# Patient Record
Sex: Female | Born: 1985
Health system: Southern US, Community
[De-identification: ages and names within clinical notes are randomized; demographics above are authoritative.]

## PROBLEM LIST (undated history)

## (undated) DIAGNOSIS — B20 Human immunodeficiency virus [HIV] disease: Secondary | ICD-10-CM

## (undated) DIAGNOSIS — D649 Anemia, unspecified: Secondary | ICD-10-CM

## (undated) DIAGNOSIS — Z21 Asymptomatic human immunodeficiency virus [HIV] infection status: Secondary | ICD-10-CM

---

## 2019-12-23 ENCOUNTER — Emergency Department
Admission: EM | Admit: 2019-12-23 | Discharge: 2019-12-23 | Disposition: A | Discharge status: Home / Self Care | Payer: Medicaid Other | Attending: Emergency Medicine | Admitting: Emergency Medicine

## 2019-12-23 ENCOUNTER — Emergency Department: Payer: Medicaid Other

## 2019-12-23 ENCOUNTER — Encounter: Payer: Self-pay | Admitting: Emergency Medicine

## 2019-12-23 ENCOUNTER — Other Ambulatory Visit: Payer: Self-pay

## 2019-12-23 DIAGNOSIS — Y990 Civilian activity done for income or pay: Secondary | ICD-10-CM | POA: Diagnosis not present

## 2019-12-23 DIAGNOSIS — S161XXA Strain of muscle, fascia and tendon at neck level, initial encounter: Secondary | ICD-10-CM | POA: Insufficient documentation

## 2019-12-23 DIAGNOSIS — S199XXA Unspecified injury of neck, initial encounter: Secondary | ICD-10-CM | POA: Diagnosis present

## 2019-12-23 DIAGNOSIS — Y93I9 Activity, other involving external motion: Secondary | ICD-10-CM | POA: Insufficient documentation

## 2019-12-23 DIAGNOSIS — X501XXA Overexertion from prolonged static or awkward postures, initial encounter: Secondary | ICD-10-CM | POA: Diagnosis not present

## 2019-12-23 DIAGNOSIS — Y9269 Other specified industrial and construction area as the place of occurrence of the external cause: Secondary | ICD-10-CM | POA: Diagnosis not present

## 2019-12-23 MED ORDER — DIAZEPAM 2 MG PO TABS
2.0000 mg | ORAL_TABLET | Freq: Three times a day (TID) | ORAL | 0 refills | Status: DC | PRN
Start: 2019-12-23 — End: 2020-11-27

## 2019-12-23 NOTE — ED Triage Notes (Signed)
Pt reports was [place] the [place] this [place] and thinks she strained her neck. Pt reports painful to look left/right or up and down

## 2019-12-23 NOTE — ED Provider Notes (Signed)
[facility] Emergency Department Provider Note   ____________________________________________   First MD Initiated Contact with Patient 12/23/19 1638     (approximate)  I have reviewed the triage vital signs and the nursing notes.   HISTORY  Chief Complaint Neck Pain   HPI Gabrielle Lambert is a 34 y.o. female who was wrestling with her children couple days ago.  She had some posterior neck pain and it hurts to look down or to either side.  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  She moved the first 3 without incident but the fourth 1 was difficult to handle and she pulled her neck muscles and now her neck is hurting worse.  She is tried Tylenol and Motrin and Aleve at home and none of it works very well.  Heating pad works very well though.  While the heating pad is hot she has pain relief but as soon as it cools off the pain comes back.  The pain is in the middle of the back around C7 and runs up both sides of the neck posteriorly.  It is moderately severe.  Tight and achy.         History reviewed. No pertinent past medical history.  There are no problems to display for this patient.   History reviewed. No pertinent surgical history.  Prior to Admission medications   Medication Sig Start Date End Date Taking? Authorizing Provider  diazepam (VALIUM) 2 MG tablet Take 1 tablet (2 mg total) by mouth every 8 (eight) hours as needed for anxiety or muscle spasms. 12/23/19 12/22/20  Arnaldo Natal, MD    Allergies Patient has no allergy information on record.  No family history on file.  Social History Social History   Tobacco Use  . Smoking status: Not on file  Substance Use Topics  . Alcohol use: Not on file  . Drug use: Not on file    Review of Systems  Constitutional: No fever/chills Eyes: No visual changes. ENT: No sore throat. Cardiovascular: Denies chest pain. Respiratory:  Denies shortness of breath. Gastrointestinal: No abdominal pain.  No nausea, no vomiting.  No diarrhea.  No constipation. Genitourinary: Negative for dysuria. Musculoskeletal: Negative for back pain. Skin: Negative for rash. Neurological: Negative for headaches, focal weakness   ____________________________________________   PHYSICAL EXAM:  VITAL SIGNS: ED Triage Vitals  Enc Vitals Group     BP 12/23/19 1506 112/72     Pulse Rate 12/23/19 1506 64     Resp 12/23/19 1506 20     Temp 12/23/19 1506 98.9 F (37.2 C)     Temp Source 12/23/19 1506 Oral     SpO2 12/23/19 1506 100 %     Weight 12/23/19 1504 161 lb (73 kg)     Height 12/23/19 1504 5\' 6"  (1.676 m)     Head Circumference --      Peak Flow --      Pain Score 12/23/19 1504 8     Pain Loc --      Pain Edu? --      Excl. in GC? --     Constitutional: Alert and oriented. Well appearing and in no acute distress.  She is sitting very stiffly though. Eyes: Conjunctivae are normal.  Head: Atraumatic. Nose: No congestion/rhinnorhea. Mouth/Throat: Mucous membranes are moist.  Oropharynx non-erythematous. Neck: No stridor.  Patient is tender to palpation midline at about C  C6-7.  Paraspinous muscles are also tender.  Cardiovascular: Normal rate, regular rhythm. Peri Jefferson peripheral circulation. Respiratory: Normal respiratory effort.  No retractions.  Musculoskeletal: See above neck. Neurologic:  Normal speech and language. No gross focal neurologic deficits are appreciated.  Skin:  Skin is warm, dry and intact. No rash noted. Psychiatric: Mood and affect are normal. Speech and behavior are normal.  ____________________________________________   LABS (all labs ordered are listed, but only abnormal results are displayed)  Labs Reviewed - No data to display ____________________________________________  EKG   ____________________________________________  RADIOLOGY  ED MD interpretation: X-rays read by radiology reviewed  by me are negative  Official radiology report(s): DG Cervical Spine Complete  Result Date: 12/23/2019 CLINICAL DATA:  34 year old female with neck pain. EXAM: CERVICAL SPINE - COMPLETE 4+ VIEW COMPARISON:  None. FINDINGS: There is no evidence of cervical spine fracture or prevertebral soft tissue swelling. Alignment is normal. No other significant bone abnormalities are identified. IMPRESSION: Negative cervical spine radiographs. Electronically Signed   By: Elgie Collard M.D.   On: 12/23/2019 17:29    ____________________________________________   PROCEDURES  Procedure(s) performed (including Critical Care):  Procedures   ____________________________________________   INITIAL IMPRESSION / ASSESSMENT AND PLAN / ED COURSE  Patient with neck pain after wrestling with her children lifting heavy boxes.  There is no evidence of any fracture or swelling on the x-rays no palpable defects or lumps.  Likely she has neck strain.  We will treat her with Motrin heat and some Valium for the muscle spasm that she is feeling.  We will give her a work note and she can follow-up with her regular physician for physical therapy.  Of course she can return if she is worse.             ____________________________________________   FINAL CLINICAL IMPRESSION(S) / ED DIAGNOSES  Final diagnoses:  Acute strain of neck muscle, initial encounter     ED Discharge Orders         Ordered    diazepam (VALIUM) 2 MG tablet  Every 8 hours PRN     Discontinue  Reprint     12/23/19 1737           Note:  This document was prepared using Dragon voice recognition software and may include unintentional dictation errors.    Arnaldo Natal, MD 12/23/19 1740

## 2019-12-23 NOTE — Discharge Instructions (Addendum)
Continue to use your heating pad and Tylenol or Motrin for pain.  Be careful not to fall asleep on a heating pad as you can get burns.  Use the Valium 2 mg up to 3 times a day to help with muscle spasms.  Be careful can make you woozy.  Do not drive on it.  Return for worse pain numbness or weakness or any other complaints.  Follow-up with your regular doctor in about a week.  It may be that some physical therapy will help once the acute pain is better.  Your doctor can arrange this for you.

## 2019-12-23 NOTE — ED Notes (Signed)
See triage note  Presents with pain to back of her neck  States she was wrestling with her children this weekend  developed pain  States pain eased off but she pulled some crutches while at work and felt increased pain

## 2019-12-28 ENCOUNTER — Other Ambulatory Visit: Payer: Self-pay

## 2019-12-28 ENCOUNTER — Encounter: Payer: Self-pay | Admitting: Emergency Medicine

## 2019-12-28 DIAGNOSIS — R6883 Chills (without fever): Secondary | ICD-10-CM | POA: Insufficient documentation

## 2019-12-28 DIAGNOSIS — J029 Acute pharyngitis, unspecified: Secondary | ICD-10-CM | POA: Insufficient documentation

## 2019-12-28 DIAGNOSIS — Z5321 Procedure and treatment not carried out due to patient leaving prior to being seen by health care provider: Secondary | ICD-10-CM | POA: Insufficient documentation

## 2019-12-28 DIAGNOSIS — R519 Headache, unspecified: Secondary | ICD-10-CM | POA: Insufficient documentation

## 2019-12-28 LAB — GROUP A STREP BY PCR: Group A Strep by PCR: NOT DETECTED

## 2019-12-28 MED ORDER — IBUPROFEN 400 MG PO TABS
400.0000 mg | ORAL_TABLET | Freq: Once | ORAL | Status: AC | PRN
  Administered 2019-12-28: 400 mg via ORAL
  Filled 2019-12-28: qty 1

## 2019-12-28 NOTE — ED Triage Notes (Signed)
Patient with complaint of chills, sore throat and headache that started yesterday.

## 2019-12-29 ENCOUNTER — Emergency Department
Admission: EM | Admit: 2019-12-29 | Discharge: 2019-12-29 | Disposition: A | Discharge status: Home / Self Care | Payer: Medicaid Other | Attending: Emergency Medicine | Admitting: Emergency Medicine

## 2019-12-29 HISTORY — DX: Anemia, unspecified: D64.9

## 2019-12-29 HISTORY — DX: Asymptomatic human immunodeficiency virus (hiv) infection status: Z21

## 2019-12-29 HISTORY — DX: Human immunodeficiency virus (HIV) disease: B20

## 2019-12-29 NOTE — ED Notes (Signed)
No answer when called to room patient.  Could not be found outside, bathroom, or in lobby

## 2020-04-09 ENCOUNTER — Emergency Department: Payer: Medicaid Other

## 2020-04-09 ENCOUNTER — Emergency Department
Admission: EM | Admit: 2020-04-09 | Discharge: 2020-04-09 | Disposition: A | Discharge status: Home / Self Care | Payer: Medicaid Other | Attending: Emergency Medicine | Admitting: Emergency Medicine

## 2020-04-09 ENCOUNTER — Other Ambulatory Visit: Payer: Self-pay

## 2020-04-09 ENCOUNTER — Encounter: Payer: Self-pay | Admitting: Emergency Medicine

## 2020-04-09 DIAGNOSIS — M546 Pain in thoracic spine: Secondary | ICD-10-CM | POA: Diagnosis present

## 2020-04-09 LAB — COMPREHENSIVE METABOLIC PANEL
ALT: 13 U/L (ref 0–44)
AST: 19 U/L (ref 15–41)
Albumin: 3.7 g/dL (ref 3.5–5.0)
Alkaline Phosphatase: 63 U/L (ref 38–126)
Anion gap: 11 (ref 5–15)
BUN: 9 mg/dL (ref 6–20)
CO2: 22 mmol/L (ref 22–32)
Calcium: 8.8 mg/dL — ABNORMAL LOW (ref 8.9–10.3)
Chloride: 105 mmol/L (ref 98–111)
Creatinine, Ser: 0.91 mg/dL (ref 0.44–1.00)
GFR, Estimated: >60 mL/min (ref >60)
Glucose, Bld: 125 mg/dL — ABNORMAL HIGH (ref 70–99)
Potassium: 4 mmol/L (ref 3.5–5.1)
Sodium: 138 mmol/L (ref 135–145)
Total Bilirubin: 0.4 mg/dL (ref 0.3–1.2)
Total Protein: 7.5 g/dL (ref 6.5–8.1)

## 2020-04-09 LAB — CBC WITH DIFFERENTIAL/PLATELET
Abs Immature Granulocytes: 0.02 10*3/uL (ref 0.00–0.07)
Basophils Absolute: 0 10*3/uL (ref 0.0–0.1)
Basophils Relative: 1 %
Eosinophils Absolute: 0.1 10*3/uL (ref 0.0–0.5)
Eosinophils Relative: 1 %
HCT: 38.8 % (ref 36.0–46.0)
Hemoglobin: 12.5 g/dL (ref 12.0–15.0)
Immature Granulocytes: 0 %
Lymphocytes Relative: 42 %
Lymphs Abs: 2.8 10*3/uL (ref 0.7–4.0)
MCH: 23 pg — ABNORMAL LOW (ref 26.0–34.0)
MCHC: 32.2 g/dL (ref 30.0–36.0)
MCV: 71.5 fL — ABNORMAL LOW (ref 80.0–100.0)
Monocytes Absolute: 0.6 10*3/uL (ref 0.1–1.0)
Monocytes Relative: 8 %
Neutro Abs: 3.2 10*3/uL (ref 1.7–7.7)
Neutrophils Relative %: 48 %
Platelets: 320 10*3/uL (ref 150–400)
RBC: 5.43 MIL/uL — ABNORMAL HIGH (ref 3.87–5.11)
RDW: 14.9 % (ref 11.5–15.5)
WBC: 6.7 10*3/uL (ref 4.0–10.5)
nRBC: 0 % (ref 0.0–0.2)

## 2020-04-09 LAB — TROPONIN I (HIGH SENSITIVITY): Troponin I (High Sensitivity): <2 ng/L (ref <18)

## 2020-04-09 LAB — FIBRIN DERIVATIVES D-DIMER (ARMC ONLY): Fibrin derivatives D-dimer (ARMC): 537.53 ng/mL (FEU) — ABNORMAL HIGH (ref 0.00–499.00)

## 2020-04-09 MED ORDER — CYCLOBENZAPRINE HCL 10 MG PO TABS: 10.0000 mg | ORAL_TABLET | Freq: Three times a day (TID) | ORAL | 0 refills | Status: AC | PRN

## 2020-04-09 MED ORDER — IOHEXOL 350 MG/ML SOLN
75.0000 mL | Freq: Once | INTRAVENOUS | Status: AC | PRN
  Administered 2020-04-09: 75 mL via INTRAVENOUS

## 2020-04-09 NOTE — ED Triage Notes (Signed)
Pt presents to ED via POV with c/o R upper back pain under her ribs, pt states pain started Monday, states pain increases with deep breaths, pt states feels SOB due to being unable to take a deep breath. Pt states initially thought pain was due to gas and has tried gas relief measures without relief. Pt A&O x4, ambulatory without difficulty, able to speak in full sentences with mild difficulty, pt states due to pain from taking a deep breath with speaking.

## 2020-04-09 NOTE — ED Notes (Addendum)
Pt reports 4-5 days ago she started with back pain and into "rib" on right side - Pt reports pain with deep inspiration and SHOB with exertion - Pt reports pain is worse with palpation  Pt reports increase in passing gas with some relief felt from passing gas yesterday

## 2020-04-09 NOTE — ED Notes (Signed)
First Nurse Note: Pt ambulatory into ED c/o right sided back pain. Pt states that it feels like the pain is under her ribs. Pt is in NAD.

## 2020-04-09 NOTE — ED Provider Notes (Signed)
[facility] Emergency Department Provider Note   ____________________________________________   First MD Initiated Contact with Patient 04/09/20 [phone]     (approximate)  I have reviewed the triage vital signs and the nursing notes.   HISTORY  Chief Complaint Back Pain and Shortness of Breath    HPI Gabrielle Lambert is a 34 y.o. female with a stated past medical history of anemia and HIV who presents for right lower thoracic and right lower lateral rib cage pain that began approximately 48 hours prior to presentation in the emergency department.  Patient describes 8/10, sharp, nonradiating back pain that is worse with deep inspiration and has no specific relieving factors.         Past Medical History:  Diagnosis Date  . Anemia   . HIV (human immunodeficiency virus infection) (HCC)     There are no problems to display for this patient.   Past Surgical History:  Procedure Laterality Date  . CESAREAN SECTION    . CESAREAN SECTION      Prior to Admission medications   Medication Sig Start Date End Date Taking? Authorizing Provider  cyclobenzaprine (FLEXERIL) 10 MG tablet Take 1 tablet (10 mg total) by mouth 3 (three) times daily as needed for up to 4 days for muscle spasms (left chest pain). 04/09/20 04/13/20  Merwyn Katos, MD  diazepam (VALIUM) 2 MG tablet Take 1 tablet (2 mg total) by mouth every 8 (eight) hours as needed for anxiety or muscle spasms. 12/23/19 12/22/20  Arnaldo Natal, MD    Allergies Patient has no known allergies.  History reviewed. No pertinent family history.  Social History Social History   Tobacco Use  . Smoking status: Never Smoker  . Smokeless tobacco: Never Used  Vaping Use  . Vaping Use: Every day  Substance Use Topics  . Alcohol use: Yes    Comment: occ  . Drug use: Never    Review of Systems Constitutional: No fever/chills Eyes: No visual changes. ENT: No sore throat. Cardiovascular: Denies chest  pain. Respiratory: Denies shortness of breath. Gastrointestinal: No abdominal pain.  No nausea, no vomiting.  No diarrhea. Genitourinary: Negative for dysuria. Musculoskeletal: Positive for acute thoracic back pain Skin: Negative for rash. Neurological: Negative for headaches, weakness/numbness/paresthesias in any extremity Psychiatric: Negative for suicidal ideation/homicidal ideation   ____________________________________________   PHYSICAL EXAM:  VITAL SIGNS: ED Triage Vitals  Enc Vitals Group     BP 04/09/20 0909 124/73     Pulse Rate 04/09/20 0909 70     Resp 04/09/20 0909 (!) 21     Temp 04/09/20 0909 99.3 F (37.4 C)     Temp Source 04/09/20 0909 Oral     SpO2 04/09/20 0909 99 %     Weight 04/09/20 0906 167 lb (75.8 kg)     Height 04/09/20 0906 5\' 6"  (1.676 m)     Head Circumference --      Peak Flow --      Pain Score 04/09/20 0906 10     Pain Loc --      Pain Edu? --      Excl. in GC? --    Constitutional: Alert and oriented. Well appearing and in no acute distress. Eyes: Conjunctivae are normal. PERRL. Head: Atraumatic. Nose: No congestion/rhinnorhea. Mouth/Throat: Mucous membranes are moist. Neck: No stridor Cardiovascular: Grossly normal heart sounds.  Good peripheral circulation. Respiratory: Normal respiratory effort.  No retractions. Gastrointestinal: Soft and nontender. No distention. Musculoskeletal: No obvious deformities.  Tenderness palpation over right lateral rib cage Neurologic:  Normal speech and language. No gross focal neurologic deficits are appreciated. Skin:  Skin is warm and dry. No rash noted. Psychiatric: Mood and affect are normal. Speech and behavior are normal.  ____________________________________________   LABS (all labs ordered are listed, but only abnormal results are displayed)  Labs Reviewed  CBC WITH DIFFERENTIAL/PLATELET - Abnormal; Notable for the following components:      Result Value   RBC 5.43 (*)    MCV 71.5 (*)     MCH 23.0 (*)    All other components within normal limits  COMPREHENSIVE METABOLIC PANEL - Abnormal; Notable for the following components:   Glucose, Bld 125 (*)    Calcium 8.8 (*)    All other components within normal limits  FIBRIN DERIVATIVES D-DIMER ([facility] ONLY) - Abnormal; Notable for the following components:   Fibrin derivatives D-dimer ([facility]) 537.53 (*)    All other components within normal limits  TROPONIN I (HIGH SENSITIVITY)  TROPONIN I (HIGH SENSITIVITY)   ____________________________________________  EKG  ED ECG REPORT I, Merwyn Katos, the attending physician, personally viewed and interpreted this ECG.  Date: 04/09/2020 EKG Time: 0906 Rate: 78 Rhythm: normal sinus rhythm QRS Axis: normal Intervals: normal ST/T Wave abnormalities: normal Narrative Interpretation: no evidence of acute ischemia  ____________________________________________  RADIOLOGY  ED MD interpretation: 2 view x-ray of the chest as well as CT angiography shows no evidence of acute abnormalities including no pulmonary emboli, pneumonia, pneumothorax, or widened mediastinum  Official radiology report(s): DG Chest 2 View  Result Date: 04/09/2020 CLINICAL DATA:  Cough. Thoracic region back pain, worse on the right. EXAM: CHEST - 2 VIEW COMPARISON:  None. FINDINGS: Heart size is normal. Mediastinal shadows are normal. The lungs are clear. No infiltrate, collapse or effusion. Question 3 mm granuloma left upper lobe, not likely significant. No bone abnormality. IMPRESSION: No active disease. Question 3 mm granuloma left upper lobe, not likely significant. Electronically Signed   By: Paulina Fusi M.D.   On: 04/09/2020 09:42   CT Angio Chest PE W/Cm &/Or Wo Cm  Result Date: 04/09/2020 CLINICAL DATA:  Suspected pulmonary embolism. Shortness of breath. Right-sided chest pain. EXAM: CT ANGIOGRAPHY CHEST WITH CONTRAST TECHNIQUE: Multidetector CT imaging of the chest was performed using the standard  protocol during bolus administration of intravenous contrast. Multiplanar CT image reconstructions and MIPs were obtained to evaluate the vascular anatomy. CONTRAST:  73mL OMNIPAQUE IOHEXOL 350 MG/ML SOLN COMPARISON:  Chest radiography same day FINDINGS: Cardiovascular: Pulmonary arterial opacification is good. There are no pulmonary emboli. Heart size is normal. No aortic pathology seen. Mediastinum/Nodes: No mass or adenopathy. Lungs/Pleura: Normal Upper Abdomen: Normal Musculoskeletal: Normal Review of the MIP images confirms the above findings. IMPRESSION: Normal examination. No pulmonary emboli or other acute chest pathology. Electronically Signed   By: Paulina Fusi M.D.   On: 04/09/2020 11:06    ____________________________________________   PROCEDURES  Procedure(s) performed (including Critical Care):  Procedures   ____________________________________________   INITIAL IMPRESSION / ASSESSMENT AND PLAN / ED COURSE  As part of my medical decision making, I reviewed the following data within the electronic MEDICAL RECORD NUMBER Nursing notes reviewed and incorporated, Labs reviewed, EKG interpreted, Old chart reviewed, Radiograph reviewed and Notes from prior ED visits reviewed and incorporated        Patient presents for low back pain. Given History and Exam the patient appears to be at low risk for Spinal Cord Compression Syndrome, Vertebral Malignancy/Mets,  acute Spinal Fracture, Vertebral Osteomyelitis, Epidural Abscess, Infected or Obstructing Kidney Stone.  Their presentation appears most likely to be secondary to non-emergent musculoskeletal etiology vs non-emergent disc herniation.  ED Workup: Defer imaging and labwork for outpatient follow up at this time.  Disposition: Discharge. Strict return precautions discussed with patient with full understanding. Advised patient to follow up promptly with primary care provider       ____________________________________________   FINAL CLINICAL IMPRESSION(S) / ED DIAGNOSES  Final diagnoses:  Acute right-sided thoracic back pain     ED Discharge Orders         Ordered    cyclobenzaprine (FLEXERIL) 10 MG tablet  3 times daily PRN        04/09/20 1205           Note:  This document was prepared using Dragon voice recognition software and may include unintentional dictation errors.   Merwyn Katos, MD 04/09/20 [phone]

## 2020-07-08 ENCOUNTER — Other Ambulatory Visit: Payer: Self-pay

## 2020-07-08 ENCOUNTER — Emergency Department
Admission: EM | Admit: 2020-07-08 | Discharge: 2020-07-08 | Disposition: A | Discharge status: Home / Self Care | Payer: Medicaid Other | Attending: Emergency Medicine | Admitting: Emergency Medicine

## 2020-07-08 DIAGNOSIS — Z21 Asymptomatic human immunodeficiency virus [HIV] infection status: Secondary | ICD-10-CM | POA: Diagnosis not present

## 2020-07-08 DIAGNOSIS — K0889 Other specified disorders of teeth and supporting structures: Secondary | ICD-10-CM | POA: Insufficient documentation

## 2020-07-08 MED ORDER — OXYCODONE-ACETAMINOPHEN 7.5-325 MG PO TABS: 1.0000 | ORAL_TABLET | ORAL | 0 refills | Status: DC | PRN

## 2020-07-08 MED ORDER — OXYCODONE-ACETAMINOPHEN 5-325 MG PO TABS
1.0000 | ORAL_TABLET | Freq: Once | ORAL | Status: AC
  Administered 2020-07-08: 1 via ORAL
  Filled 2020-07-08: qty 1

## 2020-07-08 MED ORDER — LIDOCAINE VISCOUS HCL 2 % MT SOLN
5.0000 mL | Freq: Four times a day (QID) | OROMUCOSAL | 0 refills | Status: DC | PRN
Start: 2020-07-08 — End: 2020-08-09

## 2020-07-08 MED ORDER — AMOXICILLIN 500 MG PO CAPS
500.0000 mg | ORAL_CAPSULE | Freq: Three times a day (TID) | ORAL | 0 refills | Status: DC
Start: 2020-07-08 — End: 2020-08-09

## 2020-07-08 MED ORDER — IBUPROFEN 600 MG PO TABS
600.0000 mg | ORAL_TABLET | Freq: Once | ORAL | Status: AC
  Administered 2020-07-08: 600 mg via ORAL
  Filled 2020-07-08: qty 1

## 2020-07-08 MED ORDER — IBUPROFEN 600 MG PO TABS: 600.0000 mg | ORAL_TABLET | Freq: Three times a day (TID) | ORAL | 0 refills | Status: DC | PRN

## 2020-07-08 MED ORDER — LIDOCAINE VISCOUS HCL 2 % MT SOLN
15.0000 mL | Freq: Once | OROMUCOSAL | Status: AC
  Administered 2020-07-08: 15 mL via OROMUCOSAL
  Filled 2020-07-08: qty 15

## 2020-07-08 NOTE — ED Triage Notes (Signed)
Pt c/o left upper tooth pain since Tuesday night

## 2020-07-08 NOTE — ED Provider Notes (Signed)
[facility] Emergency Department Provider Note   ____________________________________________   Event Date/Time   First MD Initiated Contact with Patient 07/08/20 0815     (approximate)  I have reviewed the triage vital signs and the nursing notes.   HISTORY  Chief Complaint Dental Pain    HPI Gabrielle Lambert is a 35 y.o. female patient presents for increased dental pain over the past 10 days.  Patient has history of impacted wisdom teeth.  Patient states the left upper molar has now developed a cavity increase in her pain.  Patient the pain is a 10/10.  Patient describes the pain as "achy".  No relief with over-the-counter Tylenol.         Past Medical History:  Diagnosis Date  . Anemia   . HIV (human immunodeficiency virus infection) (HCC)     There are no problems to display for this patient.   Past Surgical History:  Procedure Laterality Date  . CESAREAN SECTION    . CESAREAN SECTION      Prior to Admission medications   Medication Sig Start Date End Date Taking? Authorizing Provider  amoxicillin (AMOXIL) 500 MG capsule Take 1 capsule (500 mg total) by mouth 3 (three) times daily. 07/08/20  Yes Joni Reining, PA-C  ibuprofen (ADVIL) 600 MG tablet Take 1 tablet (600 mg total) by mouth every 8 (eight) hours as needed. 07/08/20  Yes Joni Reining, PA-C  lidocaine (XYLOCAINE) 2 % solution Use as directed 5 mLs in the mouth or throat every 6 (six) hours as needed for mouth pain. For oral swish. 07/08/20  Yes Joni Reining, PA-C  oxyCODONE-acetaminophen (PERCOCET) 7.5-325 MG tablet Take 1 tablet by mouth every 4 (four) hours as needed for severe pain. 07/08/20  Yes Joni Reining, PA-C  diazepam (VALIUM) 2 MG tablet Take 1 tablet (2 mg total) by mouth every 8 (eight) hours as needed for anxiety or muscle spasms. 12/23/19 12/22/20  Arnaldo Natal, MD    Allergies Patient has no known allergies.  No family history on file.  Social  History Social History   Tobacco Use  . Smoking status: Never Smoker  . Smokeless tobacco: Never Used  Vaping Use  . Vaping Use: Every day  Substance Use Topics  . Alcohol use: Yes    Comment: occ  . Drug use: Never    Review of Systems Constitutional: No fever/chills Eyes: No visual changes. ENT: No sore throat.  Dental pain Cardiovascular: Denies chest pain. Respiratory: Denies shortness of breath. Gastrointestinal: No abdominal pain.  No nausea, no vomiting.  No diarrhea.  No constipation. Genitourinary: Negative for dysuria. Musculoskeletal: Negative for back pain. Skin: Negative for rash. Neurological: Negative for headaches, focal weakness or numbness. Hematological/Lymphatic:  Anemia Allergic/Immunilogical: HIV  ____________________________________________   PHYSICAL EXAM:  VITAL SIGNS: ED Triage Vitals  Enc Vitals Group     BP 07/08/20 0756 125/89     Pulse Rate 07/08/20 0755 87     Resp 07/08/20 0755 18     Temp 07/08/20 0755 98.3 F (36.8 C)     Temp Source 07/08/20 0755 Oral     SpO2 07/08/20 0755 98 %     Weight 07/08/20 0755 170 lb (77.1 kg)     Height 07/08/20 0755 5\' 6"  (1.676 m)     Head Circumference --      Peak Flow --      Pain Score 07/08/20 0755 10     Pain Loc --  Pain Edu? --      Excl. in GC? --    Constitutional: Alert and oriented. Well appearing and in no acute distress. Eyes: Conjunctivae are normal. PERRL. EOMI. Head: Atraumatic. Nose: No congestion/rhinnorhea. Mouth/Throat: Mucous membranes are moist.  Oropharynx non-erythematous.  Gingiva edema and caries at tooth #1and 16. Neck: No stridor. Hematological/Lymphatic/Immunilogical: No cervical lymphadenopathy. Cardiovascular: Normal rate, regular rhythm. Grossly normal heart sounds.  Good peripheral circulation. Respiratory: Normal respiratory effort.  No retractions. Lungs CTAB. Neurologic:  Normal speech and language. No gross focal neurologic deficits are appreciated. No  gait instability. Skin:  Skin is warm, dry and intact. No rash noted. Psychiatric: Mood and affect are normal. Speech and behavior are normal.  ____________________________________________   LABS (all labs ordered are listed, but only abnormal results are displayed)  Labs Reviewed - No data to display ____________________________________________  EKG   ____________________________________________  RADIOLOGY I, Joni Reining, personally viewed and evaluated these images (plain radiographs) as part of my medical decision making, as well as reviewing the written report by the radiologist.  ED MD interpretation:    Official radiology report(s): No results found.  ____________________________________________   PROCEDURES  Procedure(s) performed (including Critical Care):  Procedures   ____________________________________________   INITIAL IMPRESSION / ASSESSMENT AND PLAN / ED COURSE  As part of my medical decision making, I reviewed the following data within the electronic MEDICAL RECORD NUMBER         Patient presents with pain secondary to impacted wisdom tooth with cavity.  Patient given discharge care instruction advised take medication as directed.  Patient given list of dental clinics for follow-up care.      ____________________________________________   FINAL CLINICAL IMPRESSION(S) / ED DIAGNOSES  Final diagnoses:  Pain, dental     ED Discharge Orders         Ordered    amoxicillin (AMOXIL) 500 MG capsule  3 times daily        07/08/20 0832    oxyCODONE-acetaminophen (PERCOCET) 7.5-325 MG tablet  Every 4 hours PRN        07/08/20 0832    ibuprofen (ADVIL) 600 MG tablet  Every 8 hours PRN        07/08/20 0832    lidocaine (XYLOCAINE) 2 % solution  Every 6 hours PRN        07/08/20 0454          *Please note:  Louretta Tantillo was evaluated in Emergency Department on 07/08/2020 for the symptoms described in the history of present illness. She was  evaluated in the context of the global COVID-19 pandemic, which necessitated consideration that the patient might be at risk for infection with the SARS-CoV-2 virus that causes COVID-19. Institutional protocols and algorithms that pertain to the evaluation of patients at risk for COVID-19 are in a state of rapid change based on information released by regulatory bodies including the CDC and federal and state organizations. These policies and algorithms were followed during the patient's care in the ED.  Some ED evaluations and interventions may be delayed as a result of limited staffing during and the pandemic.*   Note:  This document was prepared using Dragon voice recognition software and may include unintentional dictation errors.    Joni Reining, PA-C 07/08/20 0981    Concha Se, MD 07/08/20 [phone]

## 2020-07-08 NOTE — Discharge Instructions (Addendum)
Follow discharge care instruction take medication as directed.  Follow-up from list of dental clinics listed in your discharge care instructions. OPTIONS FOR DENTAL FOLLOW UP CARE  [place] Department of Health and Human Services - Local Safety Net Dental Clinics [email or web address]   [facility] [phone])  Sharl Ma [phone])  [place] [phone])  [facility] [phone])  [facility] [phone]) This clinic caters to the indigent population and is on a lottery system. Location: [employer] [facility], [employer], [address] [place] Clinic Hours: Wednesdays from 6pm - 9pm, patients seen by a lottery system. For dates, call or go to [email or web address] Services: Cleanings, fillings and simple extractions. Payment Options: DENTAL WORK IS FREE OF CHARGE. Bring proof of income or support. Best way to get seen: Arrive at 5:15 pm - this is a lottery, NOT first come/first serve, so arriving earlier will not increase your chances of being seen.     [facility] [phone] Select option 1 for emergencies   Location: [facility], [place], [address] [place] Clinic Hours: No walk-ins accepted - call the day before to schedule an appointment. Check in times are 9:30 am and 1:30 pm. Services: Simple extractions, temporary fillings, pulpectomy/pulp debridement, uncomplicated abscess drainage. Payment Options: PAYMENT IS DUE AT THE TIME OF SERVICE.  Fee is usually $100-200, additional surgical procedures (e.g. abscess drainage) may be extra. Cash, checks, Visa/MasterCard accepted.  Can file Medicaid if patient is covered for dental - patient should call case worker to check. No discount for [facility] patients. Best way to get seen: MUST call the day before  and get onto the schedule. Can usually be seen the next 1-2 days. No walk-ins accepted.     [facility] [phone]   Location: [facility], [address] [place] Clinic Hours: M, W, Th, F 8am or 1:30pm, Tues 9a or 1:30 - first come/first served. Services: Simple extractions, temporary fillings, uncomplicated abscess drainage.  You do not need to be an [facility] resident. Payment Options: PAYMENT IS DUE AT THE TIME OF SERVICE. Dental insurance, otherwise sliding scale - bring proof of income or support. Depending on income and treatment needed, cost is usually $50-200. Best way to get seen: Arrive early as it is first come/first served.     [facility] [phone]   Location: [address] Clinic Hours: Mon-Thu 8a-5p Services: Most basic dental services including extractions and fillings. Payment Options: PAYMENT IS DUE AT THE TIME OF SERVICE. Sliding scale, up to 50% off - bring proof if income or support. Medicaid with dental option accepted. Best way to get seen: Call to schedule an appointment, can usually be seen within 2 weeks OR they will try to see walk-ins - show up at 8a or 2p (you may have to wait).     [facility] [phone] [place] RESIDENTS ONLY   Location: [facility], [address] [place], [place] [address] Clinic Hours: By appointment only. Monday - Thursday 8am-5pm, Friday 8am-12pm Services: Cleanings, fillings, extractions. Payment Options: PAYMENT IS DUE AT THE TIME OF SERVICE. Cash, Visa or MasterCard. Sliding scale - $30 minimum per service. Best way to get seen: Come in to office, complete packet and make an appointment - need proof of income or support monies for each household member and proof of [facility] residence. Usually takes about  a month to get in.     [facility] [phone]   Location: [address] [place] Clinic Hours: Walk-in Urgent Care Dental Services are offered Monday-Friday mornings only. The numbers of emergencies accepted daily is limited to the number of providers available. Maximum 15 - Mondays, Wednesdays & Thursdays Maximum 10 - Tuesdays & Fridays Services: You do not need to be a [facility] resident to be seen for a dental emergency. Emergencies are defined as pain, swelling, abnormal bleeding, or dental trauma. Walkins will receive x-rays if needed. NOTE: Dental cleaning is not an emergency. Payment Options: PAYMENT IS DUE AT THE TIME OF SERVICE. Minimum co-pay is $40.00 for uninsured patients. Minimum co-pay is $3.00 for Medicaid with dental coverage. Dental Insurance is accepted and must be presented at time of visit. Medicare does not cover dental. Forms of payment: Cash, credit card, checks. Best way to get seen: If not previously registered with the clinic, walk-in dental registration begins at 7:15 am and is on a first come/first serve basis. If previously registered with the clinic, call to make an appointment.     [facility] [place] ONLY   Location: [address], [place], [place] Clinic Hours: Mon-Thu 10a-2p Services: Extractions only! Payment Options: FREE (donations accepted) - bring proof of income or support Best way to get seen: Call and schedule an appointment OR come at 8am on the 1st Monday of every month (except for holidays) when it is first come/first served.     [facility] [phone]   Location: [place], [place] Clinic Hours: Friday mornings Services, Payment Options, Best way to get seen: Call for info

## 2020-08-09 ENCOUNTER — Emergency Department: Payer: Medicaid Other

## 2020-08-09 ENCOUNTER — Emergency Department
Admission: EM | Admit: 2020-08-09 | Discharge: 2020-08-09 | Disposition: A | Discharge status: Home / Self Care | Payer: Medicaid Other | Attending: Emergency Medicine | Admitting: Emergency Medicine

## 2020-08-09 ENCOUNTER — Other Ambulatory Visit: Payer: Self-pay

## 2020-08-09 DIAGNOSIS — J209 Acute bronchitis, unspecified: Secondary | ICD-10-CM | POA: Diagnosis not present

## 2020-08-09 DIAGNOSIS — Z20822 Contact with and (suspected) exposure to covid-19: Secondary | ICD-10-CM | POA: Insufficient documentation

## 2020-08-09 DIAGNOSIS — R059 Cough, unspecified: Secondary | ICD-10-CM | POA: Diagnosis present

## 2020-08-09 LAB — RESP PANEL BY RT-PCR (FLU A&B, COVID) ARPGX2
Influenza A by PCR: NEGATIVE
Influenza B by PCR: NEGATIVE
SARS Coronavirus 2 by RT PCR: NEGATIVE

## 2020-08-09 MED ORDER — HYDROCOD POLST-CPM POLST ER 10-8 MG/5ML PO SUER: 5.0000 mL | Freq: Two times a day (BID) | ORAL | 0 refills | Status: DC | PRN

## 2020-08-09 MED ORDER — METHYLPREDNISOLONE 4 MG PO TBPK
ORAL_TABLET | ORAL | 0 refills | Status: DC
Start: 2020-08-09 — End: 2020-11-27

## 2020-08-09 MED ORDER — AZITHROMYCIN 250 MG PO TABS
ORAL_TABLET | ORAL | 0 refills | Status: DC
Start: 2020-08-09 — End: 2020-11-27

## 2020-08-09 NOTE — ED Provider Notes (Signed)
[facility] Emergency Department Provider Note  ____________________________________________   Event Date/Time   First MD Initiated Contact with Patient 08/09/20 1040     (approximate)  I have reviewed the triage vital signs and the nursing notes.   HISTORY  Chief Complaint URI    HPI Gabrielle Lambert is a 35 y.o. female presents to the emergency department with URI symptoms for 5 days.   Is complaining of cough, congestion, fever, chills, chest pain, shortness of breath denies close contact with Covid19+ patient, patient is vaccinated.  Patient states that the cough is productive with mucus.  States her body hurts all over.  States her son had a mild cough last week.  He has gotten better.  Patient has history of HIV   Past Medical History:  Diagnosis Date  . Anemia   . HIV (human immunodeficiency virus infection) (HCC)     There are no problems to display for this patient.   Past Surgical History:  Procedure Laterality Date  . CESAREAN SECTION    . CESAREAN SECTION      Prior to Admission medications   Medication Sig Start Date End Date Taking? Authorizing Provider  azithromycin (ZITHROMAX Z-PAK) 250 MG tablet 2 pills today then 1 pill a day for 4 days 08/09/20  Yes Pritika Alvarez, Roselyn Bering, PA-C  chlorpheniramine-HYDROcodone (TUSSIONEX PENNKINETIC ER) 10-8 MG/5ML SUER Take 5 mLs by mouth every 12 (twelve) hours as needed for cough. 08/09/20  Yes Ouita Nish, Roselyn Bering, PA-C  methylPREDNISolone (MEDROL DOSEPAK) 4 MG TBPK tablet Take 6 pills on day one then decrease by 1 pill each day 08/09/20  Yes Xzavion Doswell, Roselyn Bering, PA-C  diazepam (VALIUM) 2 MG tablet Take 1 tablet (2 mg total) by mouth every 8 (eight) hours as needed for anxiety or muscle spasms. 12/23/19 12/22/20  Arnaldo Natal, MD    Allergies Patient has no known allergies.  No family history on file.  Social History Social History   Tobacco Use  . Smoking status: Never Smoker  . Smokeless tobacco:  Never Used  Vaping Use  . Vaping Use: Every day  Substance Use Topics  . Alcohol use: Yes    Comment: occ  . Drug use: Never    Review of Systems  Constitutional: Positive fever/chills Eyes: No visual changes. ENT: Denies sore throat. Respiratory: Positive cough Cardiovascular: Positive chest pain Gastrointestinal: Denies abdominal pain Genitourinary: Negative for dysuria. Musculoskeletal: Negative for back pain. Skin: Negative for rash. Neurological: Denies neurological changes    ____________________________________________   PHYSICAL EXAM:  VITAL SIGNS: ED Triage Vitals  Enc Vitals Group     BP 08/09/20 1025 109/82     Pulse Rate 08/09/20 1025 (!) 112     Resp 08/09/20 1025 18     Temp 08/09/20 1025 98.7 F (37.1 C)     Temp Source 08/09/20 1025 Oral     SpO2 08/09/20 1025 99 %     Weight 08/09/20 1026 180 lb (81.6 kg)     Height 08/09/20 1026 5\' 6"  (1.676 m)     Head Circumference --      Peak Flow --      Pain Score 08/09/20 1026 10     Pain Loc --      Pain Edu? --      Excl. in GC? --     Constitutional: Alert and oriented. Well appearing and in no acute distress. Eyes: Conjunctivae are normal.  Head: Atraumatic. Nose: No congestion/rhinnorhea. Mouth/Throat: Mucous membranes  are moist.   Neck:  supple no lymphadenopathy noted Cardiovascular: Normal rate, regular rhythm. Heart sounds are normal Respiratory: Normal respiratory effort.  No retractions, lungs CTA GU: deferred Musculoskeletal: FROM all extremities, warm and well perfused Neurologic:  Normal speech and language.  Skin:  Skin is warm, dry and intact. No rash noted. Psychiatric: Mood and affect are normal. Speech and behavior are normal.  ____________________________________________   LABS (all labs ordered are listed, but only abnormal results are displayed)  Labs Reviewed  RESP PANEL BY RT-PCR (FLU A&B, COVID) [id number]    ____________________________________________   ____________________________________________  RADIOLOGY  Chest x-ray  ____________________________________________   PROCEDURES  Procedure(s) performed: No  Procedures    ____________________________________________   INITIAL IMPRESSION / ASSESSMENT AND PLAN / ED COURSE  Pertinent labs & imaging results that were available during my care of the patient were reviewed by me and considered in my medical decision making (see chart for details).   Patient is a HIV-positive 35 year old female who complains of URI symptoms.  Patient appears to be stable  Chest x-ray test for covid/flu   Chest x-ray reviewed by me confirmed by radiology to be normal, Covid/flu test is negative  Did explain the findings to the patient.  She is to follow-up with your regular doctor if any concerns.  Take the antibiotic, steroid, cough medication as prescribed, return emergency department for worsening. OTC measures discussed     Gabrielle Lambert was evaluated in Emergency Department on 08/09/2020 for the symptoms described in the history of present illness. She was evaluated in the context of the global COVID-19 pandemic, which necessitated consideration that the patient might be at risk for infection with the SARS-CoV-2 virus that causes COVID-19. Institutional protocols and algorithms that pertain to the evaluation of patients at risk for COVID-19 are in a state of rapid change based on information released by regulatory bodies including the CDC and federal and state organizations. These policies and algorithms were followed during the patient's care in the ED.   As part of my medical decision making, I reviewed the following data within the electronic MEDICAL RECORD NUMBER Nursing notes reviewed and incorporated, Labs reviewed , Old chart reviewed, Radiograph reviewed , Notes from prior ED visits and [place] Controlled Substance  Database  ____________________________________________   FINAL CLINICAL IMPRESSION(S) / ED DIAGNOSES  Final diagnoses:  Acute bronchitis, unspecified organism      NEW MEDICATIONS STARTED DURING THIS VISIT:  New Prescriptions   AZITHROMYCIN (ZITHROMAX Z-PAK) 250 MG TABLET    2 pills today then 1 pill a day for 4 days   CHLORPHENIRAMINE-HYDROCODONE (TUSSIONEX PENNKINETIC ER) 10-8 MG/5ML SUER    Take 5 mLs by mouth every 12 (twelve) hours as needed for cough.   METHYLPREDNISOLONE (MEDROL DOSEPAK) 4 MG TBPK TABLET    Take 6 pills on day one then decrease by 1 pill each day     Note:  This document was prepared using Dragon voice recognition software and may include unintentional dictation errors.    Faythe Ghee, PA-C 08/09/20 1243    Jene Every, MD 08/09/20 1328

## 2020-08-09 NOTE — ED Notes (Signed)
See triage note  Presents with h/a,runny nose and prod cough  States sx's started on Thursday  Unsure of fever at home but has had "hot and cold"episodes  Afebrile on arrval

## 2020-08-09 NOTE — ED Triage Notes (Signed)
Pt c/o cough with congestion, chills and body aches since thrursday

## 2020-08-09 NOTE — Discharge Instructions (Signed)
Follow-up with your regular doctor if not improving in 2 to 3 days.  Return emergency department worsening.  Take medication as prescribed.  The Tussionex has hydrocodone in it so do not mix this with any other narcotics.  Take over-the-counter emergence C to help boost your immune system.

## 2020-11-27 ENCOUNTER — Emergency Department
Admission: EM | Admit: 2020-11-27 | Discharge: 2020-11-27 | Disposition: A | Discharge status: Home / Self Care | Payer: Medicaid Other | Attending: Emergency Medicine | Admitting: Emergency Medicine

## 2020-11-27 ENCOUNTER — Other Ambulatory Visit: Payer: Self-pay

## 2020-11-27 ENCOUNTER — Emergency Department: Payer: Medicaid Other

## 2020-11-27 DIAGNOSIS — M722 Plantar fascial fibromatosis: Secondary | ICD-10-CM | POA: Diagnosis not present

## 2020-11-27 DIAGNOSIS — Z21 Asymptomatic human immunodeficiency virus [HIV] infection status: Secondary | ICD-10-CM | POA: Diagnosis not present

## 2020-11-27 DIAGNOSIS — M79672 Pain in left foot: Secondary | ICD-10-CM | POA: Diagnosis present

## 2020-11-27 MED ORDER — MELOXICAM 15 MG PO TABS: 15.0000 mg | ORAL_TABLET | Freq: Every day | ORAL | 2 refills | Status: AC

## 2020-11-27 NOTE — ED Notes (Signed)
Darl Pikes, PA has spoken with pt.

## 2020-11-27 NOTE — ED Triage Notes (Addendum)
Pt arrives from [facility] for left foot/shin pain x 1 week. Pt reports starting yesterday she started having a shooting pain up left side of body. Pt reports swelling x 2 days but improved after ace bandage wrap. No obvious swelling, redness or wounds reported by pt at this time NAD noted at this time.

## 2020-11-27 NOTE — ED Provider Notes (Signed)
[facility] Emergency Department Provider Note  ____________________________________________   Event Date/Time   First MD Initiated Contact with Patient 11/27/20 1121     (approximate)  I have reviewed the triage vital signs and the nursing notes.   HISTORY  Chief Complaint Leg Pain    HPI Gabrielle Lambert is a 35 y.o. female presents emergency department complaining of left foot pain which has been chronic but increased over the past few days.  Patient states the pain is mostly at her heel and has radiated up in the back of the tendon and across the top of the foot.  States she does get some sharp stinging sensations.  States she bought some Dr. Margart Sickles which did not help.  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  Walks on concrete daily  Past Medical History:  Diagnosis Date   Anemia    HIV (human immunodeficiency virus infection) (HCC)     There are no problems to display for this patient.   Past Surgical History:  Procedure Laterality Date   CESAREAN SECTION     CESAREAN SECTION      Prior to Admission medications   Medication Sig Start Date End Date Taking? Authorizing Provider  meloxicam (MOBIC) 15 MG tablet Take 1 tablet (15 mg total) by mouth daily. 11/27/20 11/27/21 Yes Shaneque Merkle, Roselyn Bering, PA-C    Allergies Patient has no known allergies.  History reviewed. No pertinent family history.  Social History Social History   Tobacco Use   Smoking status: Never   Smokeless tobacco: Never  Vaping Use   Vaping Use: Every day  Substance Use Topics   Alcohol use: Yes    Comment: occ   Drug use: Never    Review of Systems  Constitutional: No fever/chills Eyes: No visual changes. ENT: No sore throat. Respiratory: Denies cough Cardiovascular: Denies chest pain Gastrointestinal: Denies abdominal pain Genitourinary: Negative for dysuria. Musculoskeletal: Negative for back pain. +left foot pain Skin: Negative for  rash. Psychiatric: no mood changes,     ____________________________________________   PHYSICAL EXAM:  VITAL SIGNS: ED Triage Vitals  Enc Vitals Group     BP 11/27/20 1030 109/82     Pulse Rate 11/27/20 1030 70     Resp 11/27/20 1030 18     Temp 11/27/20 1030 98.6 F (37 C)     Temp Source 11/27/20 1030 Oral     SpO2 11/27/20 1030 99 %     Weight 11/27/20 1031 186 lb (84.4 kg)     Height 11/27/20 1031 5\' 6"  (1.676 m)     Head Circumference --      Peak Flow --      Pain Score 11/27/20 1031 10     Pain Loc --      Pain Edu? --      Excl. in GC? --     Constitutional: Alert and oriented. Well appearing and in no acute distress. Eyes: Conjunctivae are normal.  Head: Atraumatic. Nose: No congestion/rhinnorhea. Mouth/Throat: Mucous membranes are moist.   Neck:  supple no lymphadenopathy noted Cardiovascular: Normal rate, regular rhythm.  Respiratory: Normal respiratory effort.  No retractions, GU: deferred Musculoskeletal: FROM all extremities, warm and well perfused. Left heel is tender to palpation, tender at midfoot on dorsum, no swelling noted, no calf tenderness, n/v intact Neurologic:  Normal speech and language.  Skin:  Skin is warm, dry and intact. No rash noted. Psychiatric: Mood and affect are normal.  Speech and behavior are normal.  ____________________________________________   LABS (all labs ordered are listed, but only abnormal results are displayed)  Labs Reviewed - No data to display ____________________________________________   ____________________________________________  RADIOLOGY  Herby Abraham left foot  ____________________________________________   PROCEDURES  Procedure(s) performed: CAM Walker applied by nursing staff  Procedures    ____________________________________________   INITIAL IMPRESSION / ASSESSMENT AND PLAN / ED COURSE  Pertinent labs & imaging results that were available during my care of the patient were reviewed by  me and considered in my medical decision making (see chart for details).   Patient is 35 year old female presents with left foot pain.  See HPI.  Physical exam shows patient appears stable  X-ray of the left foot to rule out stress fracture or spurs  X-ray of the left foot reviewed by me confirmed by radiology to be negative for fracture or dislocation.  Heel spur is noted by me.  Did explain the findings to the patient.  Is placed in a cam boot.  Work note given.  She is to elevate and ice.  Take meloxicam.  Follow-up with podiatry.  She is discharged stable condition.  [name] was evaluated in Emergency Department on 11/27/2020 for the symptoms described in the history of present illness. She was evaluated in the context of the global COVID-19 pandemic, which necessitated consideration that the patient might be at risk for infection with the SARS-CoV-2 virus that causes COVID-19. Institutional protocols and algorithms that pertain to the evaluation of patients at risk for COVID-19 are in a state of rapid change based on information released by regulatory bodies including the CDC and federal and state organizations. These policies and algorithms were followed during the patient's care in the ED.    As part of my medical decision making, I reviewed the following data within the electronic MEDICAL RECORD NUMBER Nursing notes reviewed and incorporated, Old chart reviewed, Radiograph reviewed , Notes from prior ED visits, and [place] Controlled Substance Database  ____________________________________________   FINAL CLINICAL IMPRESSION(S) / ED DIAGNOSES  Final diagnoses:  Plantar fasciitis of left foot      NEW MEDICATIONS STARTED DURING THIS VISIT:  Discharge Medication List as of 11/27/2020 12:14 PM     START taking these medications   Details  meloxicam (MOBIC) 15 MG tablet Take 1 tablet (15 mg total) by mouth daily., Starting Sat 11/27/2020, Until Sun 11/27/2021, Normal         Note:   This document was prepared using Dragon voice recognition software and may include unintentional dictation errors.    Faythe Ghee, PA-C 11/27/20 1244    Chesley Noon, MD 12/01/20 [phone]

## 2020-11-27 NOTE — Discharge Instructions (Addendum)
Follow-up with podiatry.  Please call for an appointment.  Wear the boot to stabilize the tendon and decrease inflammation.  The meloxicam once daily will help decrease inflammation.  Return emergency department worsening

## 2021-09-03 ENCOUNTER — Emergency Department
Admission: EM | Admit: 2021-09-03 | Discharge: 2021-09-03 | Disposition: A | Discharge status: Home / Self Care | Payer: Medicaid Other | Attending: Emergency Medicine | Admitting: Emergency Medicine

## 2021-09-03 ENCOUNTER — Other Ambulatory Visit: Payer: Self-pay

## 2021-09-03 DIAGNOSIS — N3000 Acute cystitis without hematuria: Secondary | ICD-10-CM | POA: Insufficient documentation

## 2021-09-03 DIAGNOSIS — R350 Frequency of micturition: Secondary | ICD-10-CM | POA: Diagnosis present

## 2021-09-03 LAB — URINALYSIS, ROUTINE W REFLEX MICROSCOPIC
Bilirubin Urine: NEGATIVE
Glucose, UA: NEGATIVE mg/dL
Ketones, ur: NEGATIVE mg/dL
Nitrite: NEGATIVE
Protein, ur: 100 mg/dL — AB
Specific Gravity, Urine: 1.026 (ref 1.005–1.030)
WBC, UA: >50 WBC/hpf — ABNORMAL HIGH (ref 0–5)
pH: 5 (ref 5.0–8.0)

## 2021-09-03 LAB — POC URINE PREG, ED: Preg Test, Ur: NEGATIVE

## 2021-09-03 MED ORDER — CEPHALEXIN 500 MG PO CAPS: 500.0000 mg | ORAL_CAPSULE | Freq: Four times a day (QID) | ORAL | 0 refills | Status: AC

## 2021-09-03 MED ORDER — FLUCONAZOLE 150 MG PO TABS: 150.0000 mg | ORAL_TABLET | Freq: Once | ORAL | 0 refills | Status: AC

## 2021-09-03 MED ORDER — CEPHALEXIN 500 MG PO CAPS
500.0000 mg | ORAL_CAPSULE | Freq: Once | ORAL | Status: AC
  Administered 2021-09-03: 500 mg via ORAL
  Filled 2021-09-03: qty 1

## 2021-09-03 NOTE — ED Triage Notes (Addendum)
Pt to ED for urinary frequency since 3 days. Denies burning, pain but states always needs to urinate and bladder is always contracting. ?States that morning urination has strong odor. ? ?Has been trying cranberry juice and water at home with no results. ? ?Also states at night feels pain in back, mostly R lower back. ? ?Pt currently menstruating. ?

## 2021-09-03 NOTE — ED Provider Notes (Signed)
? ?[facility] ?Provider Note ? ?Patient Contact: 4:06 PM (approximate) ? ? ?History  ? ?Urinary Frequency ? ? ?HPI ? ?Gabrielle Lambert is a 36 y.o. female who presents the emergency department complaining of urinary frequency, pelvic pressure.  Patient states that she has had a UTI several years before with similar symptoms.  She has no dysuria but states that since she started having her frequency and pressure she started taking a leftover antibiotic.  She been taking amoxicillin and states that she has not had any improvement.  No fevers or chills.  No vaginal bleeding or discharge.  Patient states that sometimes she will have pressure in her lower back, more around her sacral region.  Patient with no true flank pain.  No history of nephrolithiasis. ?  ? ? ?Physical Exam  ? ?Triage Vital Signs: ?ED Triage Vitals  ?Enc Vitals Group  ?   BP 09/03/21 1555 (!) 131/93  ?   Pulse Rate 09/03/21 1555 66  ?   Resp 09/03/21 1555 16  ?   Temp 09/03/21 1555 98.1 ?F (36.7 ?C)  ?   Temp Source 09/03/21 1555 Oral  ?   SpO2 09/03/21 1555 100 %  ?   Weight 09/03/21 1558 163 lb (73.9 kg)  ?   Height 09/03/21 1558 5\' 6"  (1.676 m)  ?   Head Circumference --   ?   Peak Flow --   ?   Pain Score 09/03/21 1558 0  ?   Pain Loc --   ?   Pain Edu? --   ?   Excl. in GC? --   ? ? ?Most recent vital signs: ?Vitals:  ? 09/03/21 1555  ?BP: (!) 131/93  ?Pulse: 66  ?Resp: 16  ?Temp: 98.1 ?F (36.7 ?C)  ?SpO2: 100%  ? ? ? ?General: Alert and in no acute distress.  ?Cardiovascular:  Good peripheral perfusion ?Respiratory: Normal respiratory effort without tachypnea or retractions. Lungs CTAB. Good air entry to the bases with no decreased or absent breath sounds. ?Gastrointestinal: Bowel sounds ?4 quadrants. Soft and nontender to palpation. No guarding or rigidity. No palpable masses. No distention. No CVA tenderness. ?Musculoskeletal: Full range of motion to all extremities.  ?Neurologic:  No gross focal neurologic deficits are  appreciated.  ?Skin:   No rash noted ?Other: ? ? ?ED Results / Procedures / Treatments  ? ?Labs ?(all labs ordered are listed, but only abnormal results are displayed) ?Labs Reviewed  ?URINALYSIS, ROUTINE W REFLEX MICROSCOPIC - Abnormal; Notable for the following components:  ?    Result Value  ? Color, Urine YELLOW (*)   ? APPearance CLOUDY (*)   ? Hgb urine dipstick SMALL (*)   ? Protein, ur 100 (*)   ? Leukocytes,Ua LARGE (*)   ? WBC, UA >50 (*)   ? Bacteria, UA FEW (*)   ? All other components within normal limits  ?POC URINE PREG, ED  ? ? ? ?EKG ? ? ? ? ?RADIOLOGY ? ? ? ?No results found. ? ?PROCEDURES: ? ?Critical Care performed: No ? ?Procedures ? ? ?MEDICATIONS ORDERED IN ED: ?Medications  ?cephALEXin (KEFLEX) capsule 500 mg (has no administration in time range)  ? ? ? ?IMPRESSION / MDM / ASSESSMENT AND PLAN / ED COURSE  ?I reviewed the triage vital signs and the nursing notes. ?             ?               ? ?  Differential diagnosis includes, but is not limited to, UTI, yeast infection, interstitial cystitis ? ? ?Patient's diagnosis is consistent with UTI.  Patient presents emergency department with urinary frequency.  She is started on amoxicillin at home as she had a leftover prescription from another infection.  Patient did not have dysuria but had other symptoms to include pelvic pressure, frequency.  Overall exam was reassuring, no CVA tenderness.  Urinalysis is concerning for UTI and I will start the patient on antibiotics.  Patient has difficulty with antibiotics and typically has a yeast infection.  I recommended probiotic use but will also prescribe Diflucan for the patient.  Follow-up primary care as needed.  Return precautions discussed with the patient..  Patient is given ED precautions to return to the ED for any worsening or new symptoms. ? ? ? ?  ? ? ?FINAL CLINICAL IMPRESSION(S) / ED DIAGNOSES  ? ?Final diagnoses:  ?Acute cystitis without hematuria  ? ? ? ?Rx / DC Orders  ? ?ED Discharge  Orders   ? ?      Ordered  ?  cephALEXin (KEFLEX) 500 MG capsule  4 times daily       ? 09/03/21 1755  ?  fluconazole (DIFLUCAN) 150 MG tablet   Once       ? 09/03/21 1755  ? ?  ?  ? ?  ? ? ? ?Note:  This document was prepared using Dragon voice recognition software and may include unintentional dictation errors. ?  ?Racheal Patches, PA-C ?09/03/21 1756 ? ?  ?Gilles Chiquito, MD ?09/03/21 1835 ? ?

## 2021-12-17 ENCOUNTER — Other Ambulatory Visit: Payer: Self-pay

## 2021-12-17 ENCOUNTER — Emergency Department
Admission: EM | Admit: 2021-12-17 | Discharge: 2021-12-17 | Disposition: A | Discharge status: Home / Self Care | Payer: No Typology Code available for payment source | Attending: Emergency Medicine | Admitting: Emergency Medicine

## 2021-12-17 ENCOUNTER — Emergency Department: Payer: No Typology Code available for payment source

## 2021-12-17 DIAGNOSIS — Z21 Asymptomatic human immunodeficiency virus [HIV] infection status: Secondary | ICD-10-CM | POA: Diagnosis not present

## 2021-12-17 DIAGNOSIS — T07XXXA Unspecified multiple injuries, initial encounter: Secondary | ICD-10-CM

## 2021-12-17 DIAGNOSIS — S7012XA Contusion of left thigh, initial encounter: Secondary | ICD-10-CM | POA: Diagnosis not present

## 2021-12-17 DIAGNOSIS — Y92410 Unspecified street and highway as the place of occurrence of the external cause: Secondary | ICD-10-CM | POA: Diagnosis not present

## 2021-12-17 DIAGNOSIS — S40022A Contusion of left upper arm, initial encounter: Secondary | ICD-10-CM | POA: Diagnosis not present

## 2021-12-17 DIAGNOSIS — S4992XA Unspecified injury of left shoulder and upper arm, initial encounter: Secondary | ICD-10-CM | POA: Diagnosis present
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
## 2021-12-17 NOTE — Discharge Instructions (Signed)
Follow-up with your primary care provider if any continued problems.  Ice to any bruises or muscle pain.  Tylenol or ibuprofen as needed for inflammation and pain.

## 2021-12-23 ENCOUNTER — Emergency Department
Admission: EM | Admit: 2021-12-23 | Discharge: 2021-12-23 | Disposition: A | Discharge status: Home / Self Care | Payer: No Typology Code available for payment source | Attending: Emergency Medicine | Admitting: Emergency Medicine

## 2021-12-23 ENCOUNTER — Emergency Department: Payer: No Typology Code available for payment source

## 2021-12-23 ENCOUNTER — Encounter: Payer: Self-pay | Admitting: Emergency Medicine

## 2021-12-23 ENCOUNTER — Other Ambulatory Visit: Payer: Self-pay

## 2021-12-23 DIAGNOSIS — M25552 Pain in left hip: Secondary | ICD-10-CM | POA: Insufficient documentation

## 2021-12-23 DIAGNOSIS — M545 Low back pain, unspecified: Secondary | ICD-10-CM | POA: Insufficient documentation

## 2021-12-23 DIAGNOSIS — Y92410 Unspecified street and highway as the place of occurrence of the external cause: Secondary | ICD-10-CM | POA: Diagnosis not present

## 2021-12-23 LAB — POC URINE PREG, ED: Preg Test, Ur: NEGATIVE

## 2021-12-23 MED ORDER — KETOROLAC TROMETHAMINE 15 MG/ML IJ SOLN
15.0000 mg | Freq: Once | INTRAMUSCULAR | Status: AC
  Administered 2021-12-23: 15 mg via INTRAMUSCULAR
  Filled 2021-12-23: qty 1

## 2021-12-23 MED ORDER — LIDOCAINE 5 % EX PTCH
1.0000 | MEDICATED_PATCH | CUTANEOUS | Status: DC
  Administered 2021-12-23: 1 via TRANSDERMAL
  Filled 2021-12-23: qty 1

## 2021-12-23 MED ORDER — KETOROLAC TROMETHAMINE 15 MG/ML IJ SOLN: 15.0000 mg | Freq: Once | INTRAMUSCULAR | Status: DC

## 2021-12-23 MED ORDER — LIDOCAINE 5 % EX PTCH: 1.0000 | MEDICATED_PATCH | Freq: Two times a day (BID) | CUTANEOUS | 0 refills | Status: AC
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
## 2021-12-23 NOTE — ED Notes (Signed)
E-signature did not work, pt verbalized understanding of dc instructions.
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
## 2021-12-23 NOTE — Discharge Instructions (Addendum)
You did not wish to undergo any further imaging or blood work as was recommended.  Please return to the emergency department immediately for any new, worsening, or change in symptoms or other concerns.  You may continue to take the patches as prescribed to help with your symptoms.  It was a pleasure caring for you today.
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
## 2022-06-15 ENCOUNTER — Encounter: Payer: Self-pay | Admitting: Emergency Medicine

## 2022-06-15 ENCOUNTER — Emergency Department
Admission: EM | Admit: 2022-06-15 | Discharge: 2022-06-15 | Disposition: A | Discharge status: Home / Self Care | Payer: Medicaid Other | Attending: Emergency Medicine | Admitting: Emergency Medicine

## 2022-06-15 ENCOUNTER — Other Ambulatory Visit: Payer: Self-pay

## 2022-06-15 DIAGNOSIS — M79673 Pain in unspecified foot: Secondary | ICD-10-CM | POA: Diagnosis not present

## 2022-06-15 DIAGNOSIS — Y92410 Unspecified street and highway as the place of occurrence of the external cause: Secondary | ICD-10-CM | POA: Insufficient documentation

## 2022-06-15 DIAGNOSIS — I10 Essential (primary) hypertension: Secondary | ICD-10-CM | POA: Diagnosis not present

## 2022-06-15 DIAGNOSIS — S199XXA Unspecified injury of neck, initial encounter: Secondary | ICD-10-CM | POA: Diagnosis present

## 2022-06-15 DIAGNOSIS — M25571 Pain in right ankle and joints of right foot: Secondary | ICD-10-CM | POA: Diagnosis not present

## 2022-06-15 DIAGNOSIS — S161XXA Strain of muscle, fascia and tendon at neck level, initial encounter: Secondary | ICD-10-CM | POA: Diagnosis not present

## 2022-06-15 DIAGNOSIS — R000 Tachycardia, unspecified: Secondary | ICD-10-CM | POA: Diagnosis not present

## 2022-06-15 MED ORDER — NAPROXEN 500 MG PO TABS: 500.0000 mg | ORAL_TABLET | Freq: Two times a day (BID) | ORAL | 2 refills | Status: DC

## 2022-06-15 MED ORDER — METHOCARBAMOL 500 MG PO TABS: 500.0000 mg | ORAL_TABLET | Freq: Three times a day (TID) | ORAL | 1 refills | Status: DC | PRN
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
## 2023-03-08 IMAGING — DX DG FOOT COMPLETE 3+V*L*
3 series · 3 of 3 positions shown · non-contrast
Comparison: None.

CLINICAL DATA: Acute left foot pain.

EXAM:
LEFT FOOT - COMPLETE 3+ VIEW

[foot ap]
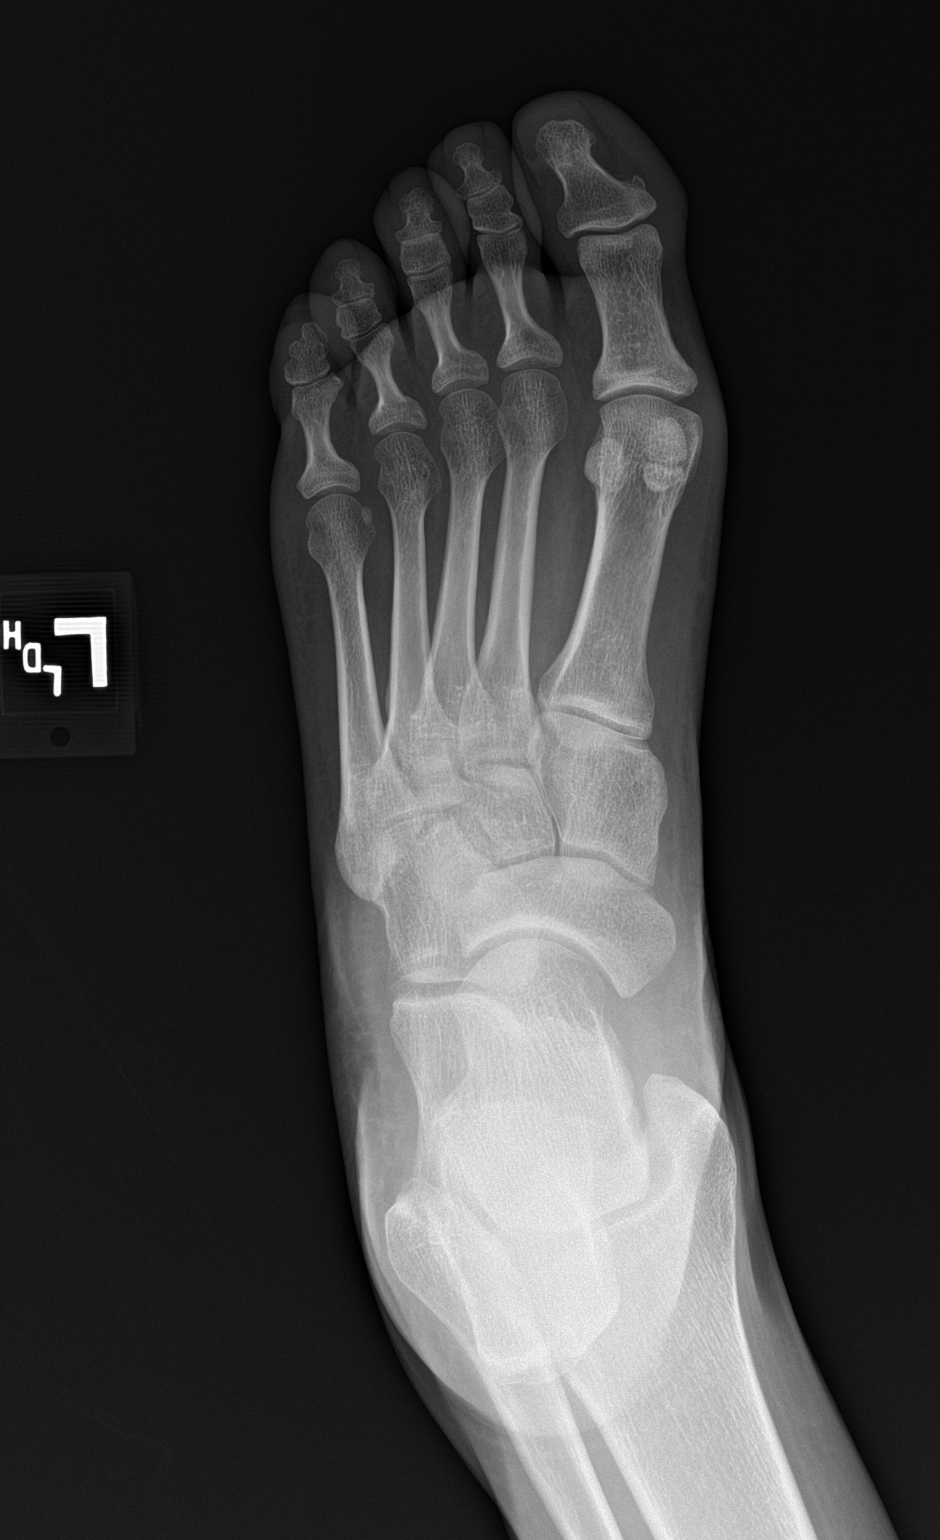

[foot obl]
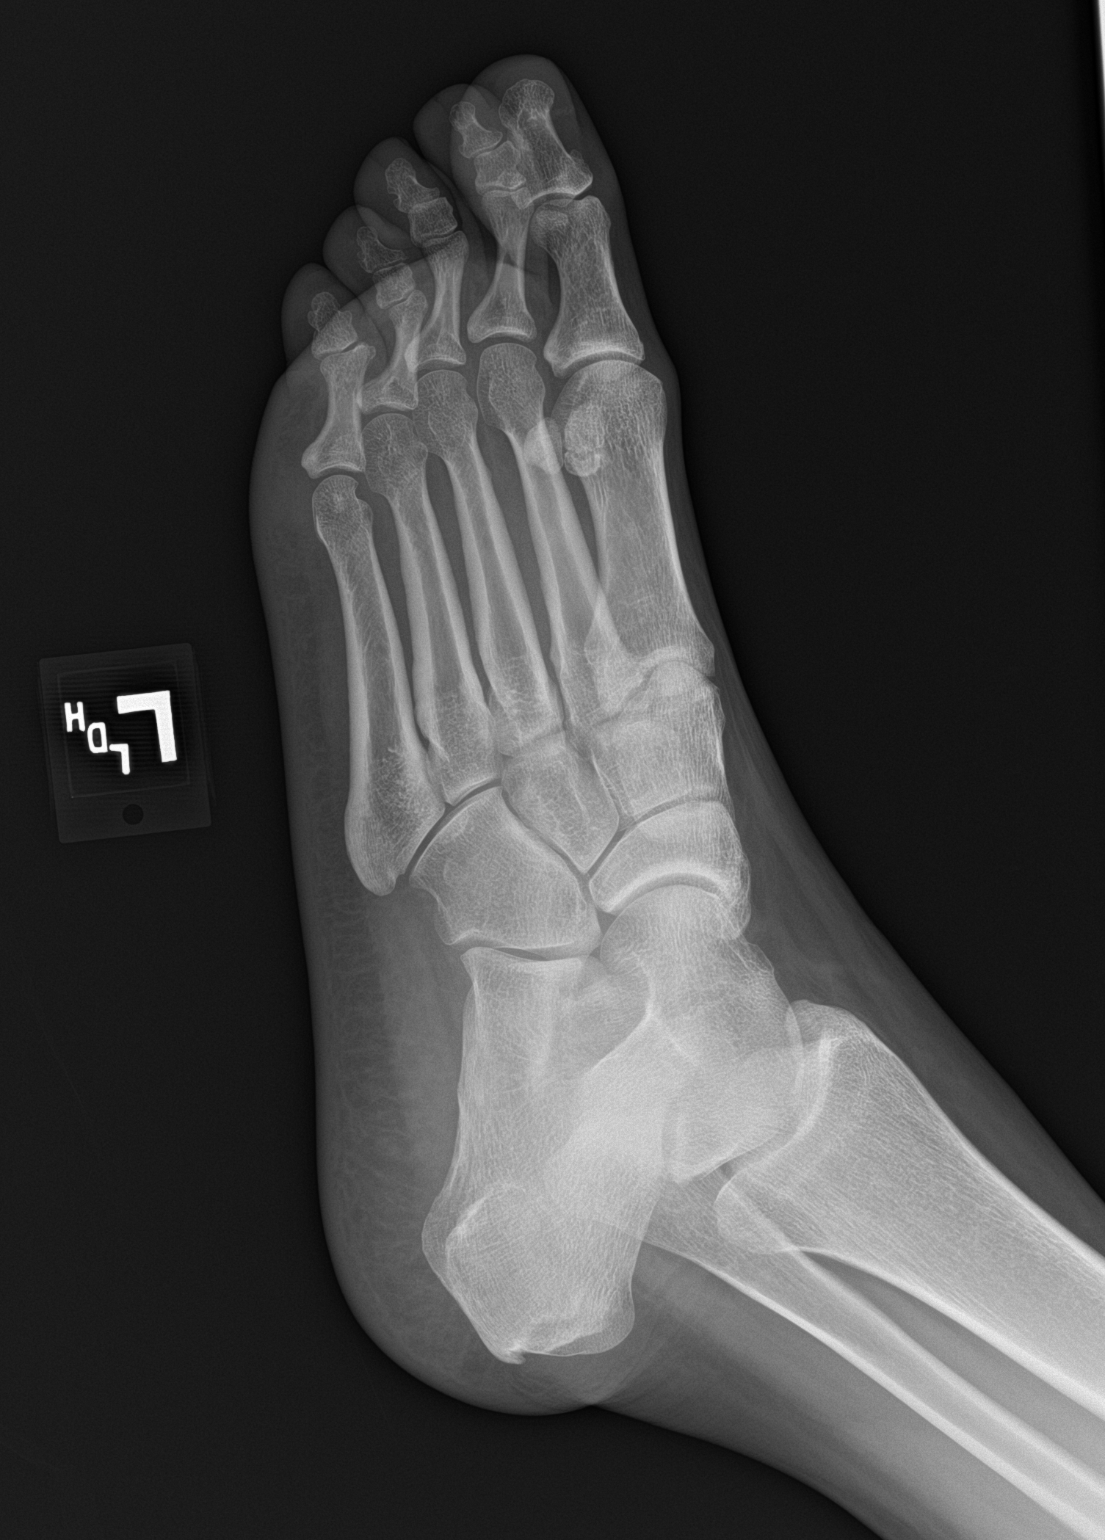

[foot lat]
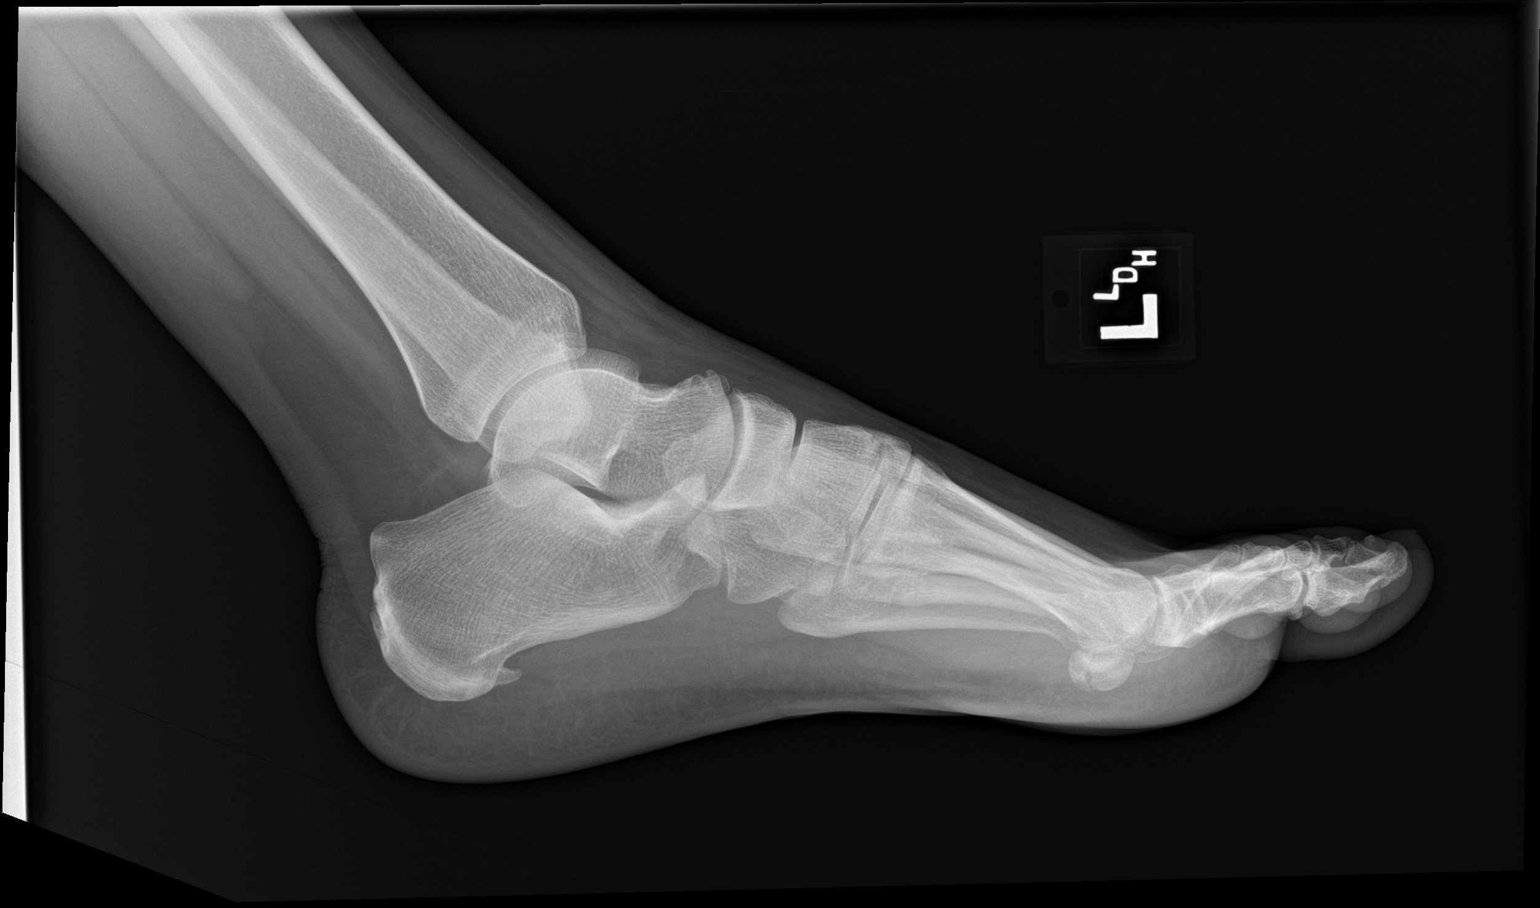

[3 of 3 positions shown; findings below may reference images not displayed]

FINDINGS: There is no evidence of fracture or dislocation. There is no
evidence of arthropathy or other focal bone abnormality. Soft
tissues are unremarkable.
IMPRESSION: Negative.

## 2023-11-01 NOTE — Progress Notes (Deleted)
  [facility] [address] [place] [address] Dept: [phone] Dept Fax: [phone]  New Patient Office Visit  Subjective:   Gabrielle Lambert May 25, 1986 11/02/2023  No chief complaint on file.   HPI: Gabrielle Lambert presents today to establish care at [facility] Introduced to Publishing rights manager role and practice setting.  All questions answered.  Concerns: See below      The following portions of the patient's history were reviewed and updated as appropriate: past medical history, past surgical history, family history, social history, allergies, medications, and problem list.   There are no active problems to display for this patient.  No past medical history on file. *** The histories are not reviewed yet. Please review them in the "History" navigator section and refresh this SmartLink. No family history on file. No current outpatient medications on file. Not on File  ROS: A complete ROS was performed with pertinent positives/negatives noted in the HPI. The remainder of the ROS are negative.   Objective:   There were no vitals filed for this visit.  GENERAL: Well-appearing, in NAD. Well nourished.  SKIN: Pink, warm and dry. No rash, lesion, ulceration, or ecchymoses.  NECK: Trachea midline. Full ROM w/o pain or tenderness. No lymphadenopathy.  RESPIRATORY: Chest wall symmetrical. Respirations even and non-labored. Breath sounds clear to auscultation bilaterally.  CARDIAC: S1, S2 present, regular rate and rhythm. Peripheral pulses 2+ bilaterally.  MSK: Muscle tone and strength appropriate for age. Joints w/o tenderness, redness, or swelling.  EXTREMITIES: Without clubbing, cyanosis, or edema.  NEUROLOGIC: No motor or sensory deficits. Steady, even gait.  PSYCH/MENTAL STATUS: Alert, oriented x 3. Cooperative, appropriate mood and affect.   Health Maintenance Due  Topic Date Due    HIV Screening  Never done   Hepatitis C Screening  Never done   DTaP/Tdap/Td (1 - Tdap) Never done   Cervical Cancer Screening (HPV/Pap Cotest)  Never done   COVID-19 Vaccine (1 - 2024-25 season) Never done    No results found for any visits on 11/02/23.  Assessment & Plan:   No orders of the defined types were placed in this encounter.  No orders of the defined types were placed in this encounter.   No follow-ups on file.   Gavin Kast, FNP

## 2023-11-02 ENCOUNTER — Ambulatory Visit: Payer: Self-pay | Admitting: Internal Medicine

## 2023-11-21 ENCOUNTER — Ambulatory Visit
Admission: EM | Admit: 2023-11-21 | Discharge: 2023-11-21 | Disposition: A | Discharge status: Home / Self Care | Attending: Family Medicine | Admitting: Family Medicine

## 2023-11-21 DIAGNOSIS — S46812A Strain of other muscles, fascia and tendons at shoulder and upper arm level, left arm, initial encounter: Secondary | ICD-10-CM | POA: Diagnosis not present

## 2023-11-21 MED ORDER — CYCLOBENZAPRINE HCL 5 MG PO TABS: 5.0000 mg | ORAL_TABLET | Freq: Every evening | ORAL | 0 refills | Status: AC | PRN

## 2023-11-21 MED ORDER — PREDNISONE 20 MG PO TABS: ORAL_TABLET | ORAL | 0 refills | Status: AC

## 2023-11-21 NOTE — ED Provider Notes (Signed)
  [facility]  Note:  This document was prepared using Conservation officer, historic buildings and may include unintentional dictation errors.  MRN: [id number] DOB: [birth year]  Subjective:   Gabrielle Lambert is a 38 y.o. female presenting for 1 week history of persistent severe left-sided pain between the base of her neck and left shoulder.  No fall, trauma, radicular symptoms, weakness, numbness or tingling, rashes.  Patient does a lot of work with lifting, processing.  Has used multiple over-the-counter medications with minimal relief.  Prior medications.  No Known Allergies  Past Medical History:  Diagnosis Date   Anemia    HIV (human immunodeficiency virus infection) (HCC)      Past Surgical History:  Procedure Laterality Date   CESAREAN SECTION     CESAREAN SECTION      History reviewed. No pertinent family history.  Social History   Tobacco Use   Smoking status: Never   Smokeless tobacco: Never  Vaping Use   Vaping status: Every Day  Substance Use Topics   Alcohol use: Yes    Comment: occ   Drug use: Never    ROS   Objective:   Vitals: BP 116/72 (BP Location: Right Arm)   Pulse 65   Temp 98.2 F (36.8 C) (Oral)   Resp 16   LMP 08/28/2023   SpO2 98%   Physical Exam Constitutional:      General: She is not in acute distress.    Appearance: Normal appearance. She is well-developed. She is not ill-appearing, toxic-appearing or diaphoretic.  HENT:     Head: Normocephalic and atraumatic.     Nose: Nose normal.     Mouth/Throat:     Mouth: Mucous membranes are moist.   Eyes:     General: No scleral icterus.       Right eye: No discharge.        Left eye: No discharge.     Extraocular Movements: Extraocular movements intact.    Cardiovascular:     Rate and Rhythm: Normal rate.  Pulmonary:     Effort: Pulmonary effort is normal.   Musculoskeletal:     Left shoulder: No swelling, deformity, effusion, laceration, tenderness,  bony tenderness or crepitus. Normal range of motion. Normal strength. Normal pulse.     Cervical back: Spasms and tenderness (over area outlined) present. No swelling, edema, deformity, erythema, signs of trauma, lacerations, rigidity, torticollis, bony tenderness or crepitus. Pain with movement present. Decreased range of motion.       Back:   Skin:    General: Skin is warm and dry.   Neurological:     General: No focal deficit present.     Mental Status: She is alert and oriented to person, place, and time.   Psychiatric:        Mood and Affect: Mood normal.        Behavior: Behavior normal.     Assessment and Plan :   PDMP not reviewed this encounter.  1. Trapezius muscle strain, left, initial encounter    Suspect inflammatory pain secondary to trapezius muscle strain from the nature of her work.  Given severity of her pain and lack of relief from NSAIDs recommend an oral prednisone course coupled with cyclobenzaprine .  Follow-up with ortho soon as possible.  Counseled patient on potential for adverse effects with medications prescribed/recommended today, ER and return-to-clinic precautions discussed, patient verbalized understanding.    Christopher Savannah, [place] 11/21/23 1739

## 2023-11-21 NOTE — ED Triage Notes (Signed)
 Pt states left shoulder pain for the past week. Denies any injury.  States she has been taking ibuprofen  at home.  Pt also states she feels off balance/lightheaded.

## 2023-12-18 ENCOUNTER — Ambulatory Visit
Admission: EM | Admit: 2023-12-18 | Discharge: 2023-12-18 | Disposition: A | Discharge status: Home / Self Care | Attending: Family Medicine | Admitting: Family Medicine

## 2023-12-18 DIAGNOSIS — N921 Excessive and frequent menstruation with irregular cycle: Secondary | ICD-10-CM

## 2023-12-18 DIAGNOSIS — R1031 Right lower quadrant pain: Secondary | ICD-10-CM

## 2023-12-18 LAB — POCT URINE PREGNANCY: Preg Test, Ur: NEGATIVE

## 2023-12-18 NOTE — ED Triage Notes (Signed)
 Pt presents to UC for c/o heavy menstrual bleeding starting yesterday. Pt also reports swelling of the right ovary. Pt has taken cyclobenzaprine  and ibuprofen  w/ minimal relief.

## 2023-12-18 NOTE — ED Provider Notes (Signed)
 [facility]    CSN: [id number] Arrival date & time: 12/18/23  1453      History   Chief Complaint No chief complaint on file.   HPI Gabrielle Lambert is a 38 y.o. female with a past medical history of anemia and HIV presents for menorrhagia.  Patient reports she does have a history of irregular periods.  She states last month her period lasted 2 weeks.  She started her menstrual cycle again yesterday and has been having heavy bleeding with some clotting.  Reports she is going through 2 pads in about 2 hours.  Also endorses some right lower quadrant pain and states it feels swollen and she can feel a bump there.  She thinks she may have a history of endometriosis but cannot be sure.  No history of uterine fibroids or ovarian cyst.  No fever chills or dysuria.  Has a history of tubal ligation.  She has been taking ibuprofen  for symptoms.  No other concerns at this time.  HPI  Past Medical History:  Diagnosis Date   Anemia    HIV (human immunodeficiency virus infection) (HCC)     There are no active problems to display for this patient.   Past Surgical History:  Procedure Laterality Date   CESAREAN SECTION     CESAREAN SECTION      OB History   No obstetric history on file.      Home Medications    Prior to Admission medications   Medication Sig Start Date End Date Taking? Authorizing Provider  cyclobenzaprine  (FLEXERIL ) 5 MG tablet Take 1 tablet (5 mg total) by mouth at bedtime as needed. 11/21/23   Christopher Savannah, PA-C  predniSONE  (DELTASONE ) 20 MG tablet Take 2 tablets daily with breakfast. 11/21/23   Christopher Savannah, PA-C    Family History History reviewed. No pertinent family history.  Social History Social History   Tobacco Use   Smoking status: Never   Smokeless tobacco: Never  Vaping Use   Vaping status: Every Day  Substance Use Topics   Alcohol use: Yes    Comment: occ   Drug use: Never     Allergies   Patient has no known allergies.   Review  of Systems Review of Systems  Gastrointestinal:  Positive for abdominal pain.  Genitourinary:  Positive for vaginal bleeding.     Physical Exam Triage Vital Signs ED Triage Vitals  Encounter Vitals Group     BP 12/18/23 1520 130/84     Girls Systolic BP Percentile --      Girls Diastolic BP Percentile --      Boys Systolic BP Percentile --      Boys Diastolic BP Percentile --      Pulse Rate 12/18/23 1520 (!) 104     Resp 12/18/23 1520 16     Temp 12/18/23 1520 98.1 F (36.7 C)     Temp Source 12/18/23 1520 Oral     SpO2 12/18/23 1520 97 %     Weight --      Height --      Head Circumference --      Peak Flow --      Pain Score 12/18/23 1517 10     Pain Loc --      Pain Education --      Exclude from Growth Chart --    No data found.  Updated Vital Signs BP 130/84 (BP Location: Left Arm)   Pulse (!) 104   Temp  98.1 F (36.7 C) (Oral)   Resp 16   LMP 12/17/2023 (Exact Date)   SpO2 97%   Visual Acuity Right Eye Distance:   Left Eye Distance:   Bilateral Distance:    Right Eye Near:   Left Eye Near:    Bilateral Near:     Physical Exam Vitals and nursing note reviewed.  Constitutional:      General: She is not in acute distress.    Appearance: Normal appearance. She is not ill-appearing.  HENT:     Head: Normocephalic and atraumatic.  Eyes:     Pupils: Pupils are equal, round, and reactive to light.  Cardiovascular:     Rate and Rhythm: Normal rate.  Pulmonary:     Effort: Pulmonary effort is normal.  Abdominal:     General: Bowel sounds are normal.     Palpations: Abdomen is soft.     Tenderness: There is abdominal tenderness in the right lower quadrant.     Comments: A movable tender inguinal lymph node to the right.  There is also a palpable movable nontender lymph node to the left inguinal canal.  No visible swelling erythema warmth.  Neurological:     General: No focal deficit present.     Mental Status: She is alert and oriented to person,  place, and time.  Psychiatric:        Mood and Affect: Mood normal.        Behavior: Behavior normal.      UC Treatments / Results  Labs (all labs ordered are listed, but only abnormal results are displayed) Labs Reviewed  POCT URINE PREGNANCY    EKG   Radiology No results found.  Procedures Procedures (including critical care time)  Medications Ordered in UC Medications - No data to display  Initial Impression / Assessment and Plan / UC Course  I have reviewed the triage vital signs and the nursing notes.  Pertinent labs & imaging results that were available during my care of the patient were reviewed by me and considered in my medical decision making (see chart for details).     Reviewed exam and symptoms with patient.  Discussed limitations and abilities of urgent care.  Patient with menorrhagia and right lower quadrant pain that is a stabbing pain.  Urine hCG negative.  Exam does show lymph node enlargement.  Given her symptoms advise she go to the ER for further workup/imaging.  She is in agreement plan will go POV to the ER. Final Clinical Impressions(s) / UC Diagnoses   Final diagnoses:  Menorrhagia with irregular cycle  Right lower quadrant pain     Discharge Instructions      Please go to the ER for further evaluation of your symptoms    ED Prescriptions   None    PDMP not reviewed this encounter.   Loreda Myla SAUNDERS, NP 12/18/23 [phone]

## 2023-12-18 NOTE — Discharge Instructions (Addendum)
 Please go to the ER for further evaluation of your symptoms

## 2023-12-18 NOTE — ED Notes (Signed)
 Patient is being discharged from the Urgent Care and sent to the Emergency Department via POV. Per Myla Bold, NP, patient is in need of higher level of care due to menorrhagia. Patient is aware and verbalizes understanding of plan of care.  Vitals:   12/18/23 1520  BP: 130/84  Pulse: (!) 104  Resp: 16  Temp: 98.1 F (36.7 C)  SpO2: 97%

## 2024-01-29 ENCOUNTER — Ambulatory Visit (HOSPITAL_BASED_OUTPATIENT_CLINIC_OR_DEPARTMENT_OTHER): Admitting: Family Medicine

## 2024-02-10 ENCOUNTER — Other Ambulatory Visit: Payer: Self-pay

## 2024-02-10 ENCOUNTER — Emergency Department (HOSPITAL_COMMUNITY)
Admission: EM | Admit: 2024-02-10 | Discharge: 2024-02-10 | Disposition: A | Discharge status: Home / Self Care | Attending: Emergency Medicine | Admitting: Emergency Medicine

## 2024-02-10 DIAGNOSIS — Z21 Asymptomatic human immunodeficiency virus [HIV] infection status: Secondary | ICD-10-CM | POA: Diagnosis not present

## 2024-02-10 DIAGNOSIS — Z79899 Other long term (current) drug therapy: Secondary | ICD-10-CM | POA: Insufficient documentation

## 2024-02-10 DIAGNOSIS — H16001 Unspecified corneal ulcer, right eye: Secondary | ICD-10-CM | POA: Diagnosis not present

## 2024-02-10 DIAGNOSIS — H5711 Ocular pain, right eye: Secondary | ICD-10-CM | POA: Diagnosis present

## 2024-02-10 MED ORDER — TETRACAINE HCL 0.5 % OP SOLN
2.0000 [drp] | Freq: Once | OPHTHALMIC | Status: DC
Start: 2024-02-10 — End: 2024-02-10
  Filled 2024-02-10: qty 4

## 2024-02-10 MED ORDER — FLUORESCEIN SODIUM 1 MG OP STRP
1.0000 | ORAL_STRIP | Freq: Once | OPHTHALMIC | Status: DC
Start: 2024-02-10 — End: 2024-02-10
  Filled 2024-02-10: qty 1

## 2024-02-10 NOTE — ED Provider Notes (Signed)
  [place] EMERGENCY DEPARTMENT AT [facility] Provider Note   CSN: [id number] Arrival date & time: 02/10/24  1158     Patient presents with: Foreign Body in Eye   Gabrielle Lambert is a 38 y.o. female.    Foreign Body in Eye  Patient with erythema and pain in the right eye.  Began on Thursday with today being Sunday.  Woke up with it after a day at work.  No known injury.  Had a white spot on the eye.  Got seen in urgent care and started on Vigamox drops.  Plan for ophthalmology follow-up but if could not get in plan was to come back in couple days.  Comes back today.  States symptoms are doing much better.  Less pain less redness.  Photophobia improved.    Past Medical History:  Diagnosis Date   Anemia    HIV (human immunodeficiency virus infection) (HCC)     Prior to Admission medications   Medication Sig Start Date End Date Taking? Authorizing Provider  cyclobenzaprine  (FLEXERIL ) 5 MG tablet Take 1 tablet (5 mg total) by mouth at bedtime as needed. 11/21/23   Christopher Savannah, PA-C  predniSONE  (DELTASONE ) 20 MG tablet Take 2 tablets daily with breakfast. 11/21/23   Christopher Savannah, PA-C    Allergies: Patient has no known allergies.    Review of Systems  Updated Vital Signs BP 115/83 (BP Location: Right Arm)   Pulse (!) 57   Temp 98.6 F (37 C) (Oral)   Resp 15   SpO2 100%   Physical Exam Vitals and nursing note reviewed.  Eyes:     Comments: Mild corneal injection on right.  At around 430 on the right cornea has a 1 to 2 mm wider area.  No drainage.  Under fluorescein  evaluation there is a area in the center of the potential ulcer that has more uptake.  No drainage.  Potentially ulcer.  Cardiovascular:     Rate and Rhythm: Regular rhythm.  Neurological:     Mental Status: She is alert.     (all labs ordered are listed, but only abnormal results are displayed) Labs Reviewed - No data to display  EKG: None  Radiology: No results found.   Procedures    Medications Ordered in the ED  tetracaine  (PONTOCAINE) 0.5 % ophthalmic solution 2 drop (has no administration in time range)  fluorescein  ophthalmic strip 1 strip (has no administration in time range)                                    Medical Decision Making Risk Prescription drug management.   Patient with right eye pain.  Does have a white area on the cornea.  Has been on antibiotic drops.  Differential diagnosis includes foreign body and ulcer.  Clinically is improving.  Feeling much better.  Less photophobia.  Will continue drops.  Do not think we need further imaging at this time.  Given our ophthalmologist information in terms of follow-up.  Appears stable for discharge home     Final diagnoses:  Cornea ulcer, right    ED Discharge Orders     None          Patsey Lot, MD 02/10/24 1330

## 2024-02-10 NOTE — ED Triage Notes (Signed)
 Pt reports right eye irritation since Friday. Woke up and saw a white speck in eye. Seen at [facility], was unable to remove anything. Got abx drops that she has been using. Still sees the white speck. Has felt some improvement with the drops but still having irritation. Eye is red

## 2024-02-10 NOTE — Discharge Instructions (Signed)
 Continue the eyedrops.  Follow-up with either the [facility] or our ophthalmologist.

## 2024-03-18 ENCOUNTER — Ambulatory Visit
Admission: EM | Admit: 2024-03-18 | Discharge: 2024-03-18 | Disposition: A | Discharge status: Home / Self Care | Attending: Emergency Medicine | Admitting: Emergency Medicine

## 2024-03-18 ENCOUNTER — Encounter: Payer: Self-pay | Admitting: Emergency Medicine

## 2024-03-18 DIAGNOSIS — B349 Viral infection, unspecified: Secondary | ICD-10-CM

## 2024-03-18 MED ORDER — ONDANSETRON HCL 4 MG PO TABS: 4.0000 mg | ORAL_TABLET | Freq: Four times a day (QID) | ORAL | 0 refills | Status: AC

## 2024-03-18 NOTE — ED Triage Notes (Signed)
 Pt c/o headache, diarrhea, nausea, loss of appetite, and vomiting since yesterday

## 2024-03-18 NOTE — Discharge Instructions (Addendum)
 Stop smoking , take Zofran as directed ,most likely you have a viral illness: no antibiotic as indicated at this time, May treat with OTC meds of choice. Make sure to drink plenty of fluids to stay hydrated(gatorade, water, popsicles,jello,etc), avoid caffeine products. Follow up with PCP. Return as needed.

## 2024-03-18 NOTE — ED Provider Notes (Signed)
 [facility]    CSN: [id number] Arrival date & time: 03/18/24  0813      History   Chief Complaint Chief Complaint  Patient presents with   Diarrhea   Nausea    HPI Gabrielle Lambert is a 38 y.o. female.   38 year old female, Gabrielle Lambert, presents to urgent care for evaluation of nausea and diarrhea.  Patient states she is drinking well and has a couple coffee in the office with her.  Patient states she vomited once this morning, twice yesterday, also complaining of abdominal cramps with diarrhea, requesting work note.  Pmh: Anemia, HIV, asthma, vape use,marijuana smoker  The history is provided by the patient. No language interpreter was used.    Past Medical History:  Diagnosis Date   Anemia    HIV (human immunodeficiency virus infection) [facility])     Patient Active Problem List   Diagnosis Date Noted   Nonspecific syndrome suggestive of viral illness 03/18/2024    Past Surgical History:  Procedure Laterality Date   CESAREAN SECTION     CESAREAN SECTION      OB History   No obstetric history on file.      Home Medications    Prior to Admission medications   Medication Sig Start Date End Date Taking? Authorizing Provider  ondansetron (ZOFRAN) 4 MG tablet Take 1 tablet (4 mg total) by mouth every 6 (six) hours. 03/18/24  Yes Graeden Bitner, NP  cyclobenzaprine  (FLEXERIL ) 5 MG tablet Take 1 tablet (5 mg total) by mouth at bedtime as needed. 11/21/23   Christopher Savannah, PA-C  predniSONE  (DELTASONE ) 20 MG tablet Take 2 tablets daily with breakfast. Patient not taking: Reported on 03/18/2024 11/21/23   Christopher Savannah, PA-C    Family History History reviewed. No pertinent family history.  Social History Social History   Tobacco Use   Smoking status: Never   Smokeless tobacco: Never  Vaping Use   Vaping status: Every Day  Substance Use Topics   Alcohol use: Yes    Comment: occ   Drug use: Never     Allergies   Patient has no known  allergies.   Review of Systems Review of Systems  Constitutional:  Negative for fever.  Gastrointestinal:  Positive for diarrhea and nausea.  Neurological:  Positive for headaches.  All other systems reviewed and are negative.    Physical Exam Triage Vital Signs ED Triage Vitals  Encounter Vitals Group     BP      Girls Systolic BP Percentile      Girls Diastolic BP Percentile      Boys Systolic BP Percentile      Boys Diastolic BP Percentile      Pulse      Resp      Temp      Temp src      SpO2      Weight      Height      Head Circumference      Peak Flow      Pain Score      Pain Loc      Pain Education      Exclude from Growth Chart    No data found.  Updated Vital Signs BP 119/85 (BP Location: Right Arm)   Pulse 65   Temp 98.3 F (36.8 C) (Oral)   Resp 16   LMP 02/28/2024   SpO2 97%   Visual Acuity Right Eye Distance:   Left Eye Distance:  Bilateral Distance:    Right Eye Near:   Left Eye Near:    Bilateral Near:     Physical Exam Vitals and nursing note reviewed.  Constitutional:      Appearance: She is well-developed and well-groomed.  HENT:     Head: Normocephalic.     Mouth/Throat:     Lips: Pink.     Mouth: Mucous membranes are moist.  Cardiovascular:     Rate and Rhythm: Normal rate and regular rhythm.     Heart sounds: Normal heart sounds.  Pulmonary:     Effort: Pulmonary effort is normal.     Breath sounds: Normal breath sounds and air entry.  Abdominal:     General: Bowel sounds are increased.     Palpations: Abdomen is soft.     Tenderness: There is no abdominal tenderness. There is no guarding or rebound.  Skin:    General: Skin is warm.     Capillary Refill: Capillary refill takes less than 2 seconds.  Neurological:     Mental Status: She is alert and oriented to person, place, and time.  Psychiatric:        Attention and Perception: Attention normal.        Mood and Affect: Mood normal.        Speech: Speech  normal.        Behavior: Behavior normal. Behavior is cooperative.      UC Treatments / Results  Labs (all labs ordered are listed, but only abnormal results are displayed) Labs Reviewed - No data to display  EKG   Radiology No results found.  Procedures Procedures (including critical care time)  Medications Ordered in UC Medications - No data to display  Initial Impression / Assessment and Plan / UC Course  I have reviewed the triage vital signs and the nursing notes.  Pertinent labs & imaging results that were available during my care of the patient were reviewed by me and considered in my medical decision making (see chart for details).    Discussed exam findings and plan of care with patient, will refer to PCP/need ID for HIV follow up, zofran scripted, push fluids, avoid caffeine and smoking, strict go to ER precautions given.   Patient verbalized understanding to this provider.  Ddx: Viral illness, gastroenteritis, stress, HIV exaccerbation Final Clinical Impressions(s) / UC Diagnoses   Final diagnoses:  Nonspecific syndrome suggestive of viral illness   Discharge Instructions   None    ED Prescriptions     Medication Sig Dispense Auth. Provider   ondansetron (ZOFRAN) 4 MG tablet Take 1 tablet (4 mg total) by mouth every 6 (six) hours. 12 tablet Dnya Hickle, Rilla, NP      PDMP not reviewed this encounter.   Aminta Rilla, NP 03/18/24 [phone]

## 2024-06-11 ENCOUNTER — Emergency Department (HOSPITAL_COMMUNITY)
Admission: EM | Admit: 2024-06-11 | Discharge: 2024-06-12 | Disposition: A | Discharge status: Home / Self Care | Attending: Emergency Medicine | Admitting: Emergency Medicine

## 2024-06-11 DIAGNOSIS — R1030 Lower abdominal pain, unspecified: Secondary | ICD-10-CM | POA: Insufficient documentation

## 2024-06-11 DIAGNOSIS — R590 Localized enlarged lymph nodes: Secondary | ICD-10-CM | POA: Diagnosis present

## 2024-06-11 LAB — COMPREHENSIVE METABOLIC PANEL WITH GFR
ALT: 23 U/L (ref 0–44)
AST: 35 U/L (ref 15–41)
Albumin: 3.6 g/dL (ref 3.5–5.0)
Alkaline Phosphatase: 69 U/L (ref 38–126)
Anion gap: 7 (ref 5–15)
BUN: 10 mg/dL (ref 6–20)
CO2: 25 mmol/L (ref 22–32)
Calcium: 8.8 mg/dL — ABNORMAL LOW (ref 8.9–10.3)
Chloride: 107 mmol/L (ref 98–111)
Creatinine, Ser: 0.83 mg/dL (ref 0.44–1.00)
GFR, Estimated: >60 mL/min
Glucose, Bld: 111 mg/dL — ABNORMAL HIGH (ref 70–99)
Potassium: 3.8 mmol/L (ref 3.5–5.1)
Sodium: 138 mmol/L (ref 135–145)
Total Bilirubin: 0.4 mg/dL (ref 0.0–1.2)
Total Protein: 9 g/dL — ABNORMAL HIGH (ref 6.5–8.1)

## 2024-06-11 LAB — CBC WITH DIFFERENTIAL/PLATELET
Abs Immature Granulocytes: 0.02 K/uL (ref 0.00–0.07)
Basophils Absolute: 0 K/uL (ref 0.0–0.1)
Basophils Relative: 0 %
Eosinophils Absolute: 0 K/uL (ref 0.0–0.5)
Eosinophils Relative: 0 %
HCT: 30.7 % — ABNORMAL LOW (ref 36.0–46.0)
Hemoglobin: 9.4 g/dL — ABNORMAL LOW (ref 12.0–15.0)
Immature Granulocytes: 0 %
Lymphocytes Relative: 51 %
Lymphs Abs: 2.5 K/uL (ref 0.7–4.0)
MCH: 19.9 pg — ABNORMAL LOW (ref 26.0–34.0)
MCHC: 30.6 g/dL (ref 30.0–36.0)
MCV: 64.9 fL — ABNORMAL LOW (ref 80.0–100.0)
Monocytes Absolute: 0.6 K/uL (ref 0.1–1.0)
Monocytes Relative: 11 %
Neutro Abs: 1.9 K/uL (ref 1.7–7.7)
Neutrophils Relative %: 38 %
Platelets: 416 K/uL — ABNORMAL HIGH (ref 150–400)
RBC: 4.73 MIL/uL (ref 3.87–5.11)
RDW: 16.8 % — ABNORMAL HIGH (ref 11.5–15.5)
Smear Review: NORMAL
WBC: 4.9 K/uL (ref 4.0–10.5)
nRBC: 0 % (ref 0.0–0.2)

## 2024-06-11 LAB — HCG, SERUM, QUALITATIVE: Preg, Serum: NEGATIVE

## 2024-06-11 NOTE — ED Triage Notes (Signed)
 Patient complains of lower back pain and lower abdominal pain. Patient states her is having heavy bleeding. Sharp stabbing pains. Rates 9/10.

## 2024-06-12 ENCOUNTER — Emergency Department (HOSPITAL_COMMUNITY)

## 2024-06-12 ENCOUNTER — Encounter (HOSPITAL_COMMUNITY): Payer: Self-pay

## 2024-06-12 LAB — URINALYSIS, ROUTINE W REFLEX MICROSCOPIC
Bacteria, UA: NONE SEEN
Bilirubin Urine: NEGATIVE
Glucose, UA: NEGATIVE mg/dL
Ketones, ur: NEGATIVE mg/dL
Nitrite: NEGATIVE
Protein, ur: NEGATIVE mg/dL
Specific Gravity, Urine: 1.034 — ABNORMAL HIGH (ref 1.005–1.030)
pH: 7 (ref 5.0–8.0)

## 2024-06-12 LAB — SYPHILIS: RPR W/REFLEX TO RPR TITER AND TREPONEMAL ANTIBODIES, TRADITIONAL SCREENING AND DIAGNOSIS ALGORITHM: RPR Ser Ql: NONREACTIVE

## 2024-06-12 LAB — HIV ANTIBODY (ROUTINE TESTING W REFLEX): HIV Screen 4th Generation wRfx: REACTIVE — AB

## 2024-06-12 MED ORDER — CEPHALEXIN 500 MG PO CAPS: 500.0000 mg | ORAL_CAPSULE | Freq: Four times a day (QID) | ORAL | 0 refills | Status: AC

## 2024-06-12 MED ORDER — IOHEXOL 300 MG/ML  SOLN
100.0000 mL | Freq: Once | INTRAMUSCULAR | Status: AC | PRN
  Administered 2024-06-12: 100 mL via INTRAVENOUS

## 2024-06-12 NOTE — Discharge Instructions (Signed)
 Follow up with your primary care provider for recheck. Take antibiotics as prescribed. Follow up in your MyChart for your lab tests that were sent out today as discussed. Return to the ER for any worsening or concerning symptoms.

## 2024-06-12 NOTE — ED Notes (Signed)
 Patient to CT

## 2024-06-12 NOTE — ED Provider Notes (Signed)
 " [place] EMERGENCY DEPARTMENT AT [facility] Provider Note   CSN: [id number] Arrival date & time: 06/11/24  2139     Patient presents with: No chief complaint on file.   Gabrielle Lambert is a 39 y.o. female.   39 year old female presents emergency room with concern of bilateral inguinal boils.  Notes pain started a few days ago, progressively worsening.  States she is also on her menstrual cycle although states her cycles have been variable due to being perimenopausal.  History of anemia.  She denies changes in bowel or bladder habits, abnormal vaginal discharge, fevers, nausea or vomiting.       Prior to Admission medications  Medication Sig Start Date End Date Taking? Authorizing Provider  cephALEXin  (KEFLEX ) 500 MG capsule Take 1 capsule (500 mg total) by mouth 4 (four) times daily for 7 days. 06/12/24 06/19/24 Yes Beverley Leita LABOR, PA-C  cyclobenzaprine  (FLEXERIL ) 5 MG tablet Take 1 tablet (5 mg total) by mouth at bedtime as needed. 11/21/23   Christopher Savannah, PA-C  ondansetron  (ZOFRAN ) 4 MG tablet Take 1 tablet (4 mg total) by mouth every 6 (six) hours. 03/18/24   Defelice, Jeanette, NP  predniSONE  (DELTASONE ) 20 MG tablet Take 2 tablets daily with breakfast. Patient not taking: Reported on 03/18/2024 11/21/23   Christopher Savannah, PA-C    Allergies: Patient has no known allergies.    Review of Systems Negative except as per HPI Updated Vital Signs BP (!) 122/92   Pulse 64   Temp 98.4 F (36.9 C)   Resp 16   SpO2 99%   Physical Exam Vitals and nursing note reviewed.  Constitutional:      General: She is not in acute distress.    Appearance: She is well-developed. She is not diaphoretic.  HENT:     Head: Normocephalic and atraumatic.  Pulmonary:     Effort: Pulmonary effort is normal.  Abdominal:     Palpations: Abdomen is soft.     Tenderness: There is abdominal tenderness.     Comments: Mild lower abdominal discomfort with bilateral inguinal lymphadenopathy, no  overlying erythema.  Lymph nodes are fairly enlarged measuring about 2 to 3 cm, mobile, tender, rubbery  Skin:    General: Skin is warm and dry.     Findings: No erythema or rash.  Neurological:     Mental Status: She is alert and oriented to person, place, and time.  Psychiatric:        Behavior: Behavior normal.     (all labs ordered are listed, but only abnormal results are displayed) Labs Reviewed  COMPREHENSIVE METABOLIC PANEL WITH GFR - Abnormal; Notable for the following components:      Result Value   Glucose, Bld 111 (*)    Calcium 8.8 (*)    Total Protein 9.0 (*)    All other components within normal limits  CBC WITH DIFFERENTIAL/PLATELET - Abnormal; Notable for the following components:   Hemoglobin 9.4 (*)    HCT 30.7 (*)    MCV 64.9 (*)    MCH 19.9 (*)    RDW 16.8 (*)    Platelets 416 (*)    All other components within normal limits  URINALYSIS, ROUTINE W REFLEX MICROSCOPIC - Abnormal; Notable for the following components:   Specific Gravity, Urine 1.034 (*)    Hgb urine dipstick SMALL (*)    Leukocytes,Ua TRACE (*)    All other components within normal limits  HCG, SERUM, QUALITATIVE  SYPHILIS: RPR W/REFLEX TO RPR  TITER AND TREPONEMAL ANTIBODIES, TRADITIONAL SCREENING AND DIAGNOSIS ALGORITHM  HIV ANTIBODY (ROUTINE TESTING W REFLEX)  GC/CHLAMYDIA PROBE AMP ([place]) NOT AT [facility]    EKG: None  Radiology: CT ABDOMEN PELVIS W CONTRAST Result Date: 06/12/2024 CLINICAL DATA:  Lower abdominal pain EXAM: CT ABDOMEN AND PELVIS WITH CONTRAST TECHNIQUE: Multidetector CT imaging of the abdomen and pelvis was performed using the standard protocol following bolus administration of intravenous contrast. RADIATION DOSE REDUCTION: This exam was performed according to the departmental dose-optimization program which includes automated exposure control, adjustment of the mA and/or kV according to patient size and/or use of iterative reconstruction technique. CONTRAST:   OMNIPAQUE  IOHEXOL  300 MG/ML  SOLN COMPARISON:  None Available. FINDINGS: Lower chest: No acute abnormality. Hepatobiliary: No focal liver abnormality is seen. Gallbladder is well distended. The wall is focally thickened. No cholelithiasis is noted. Pancreas: Unremarkable. No pancreatic ductal dilatation or surrounding inflammatory changes. Spleen: Normal in size without focal abnormality. Adrenals/Urinary Tract: Adrenal glands are within normal limits. Kidneys are well visualized bilaterally. No renal calculi or obstructive changes are seen. The bladder is well distended. Stomach/Bowel: No obstructive or inflammatory changes of the colon are seen. The appendix is air-filled and within normal limits. Small bowel and stomach are unremarkable. Vascular/Lymphatic: No significant vascular findings are present. No enlarged abdominal or pelvic lymph nodes. Reproductive: Uterus is retroflexed but otherwise within normal limits. No adnexal mass is seen. Other: No abdominal wall hernia or abnormality. No abdominopelvic ascites. Musculoskeletal: No acute or significant osseous findings. IMPRESSION: Focal thickening of the gallbladder wall. No definitive cholelithiasis is seen. Ultrasound may be helpful for further evaluation. No other focal abnormality is noted. Electronically Signed   By: Oneil Devonshire M.D.   On: 06/12/2024 03:55     Procedures   Medications Ordered in the ED  iohexol  (OMNIPAQUE ) 300 MG/ML solution 100 mL (100 mLs Intravenous Contrast Given 06/12/24 0326)                                    Medical Decision Making Amount and/or Complexity of Data Reviewed Labs: ordered. Radiology: ordered.  Risk Prescription drug management.   This patient presents to the ED for concern of groin pain  this involves an extensive number of treatment options, and is a complaint that carries with it a high risk of complications and morbidity.  The differential diagnosis includes but not limited to abscess,  lymphadenopathy   Co morbidities / Chronic conditions that complicate the patient evaluation  Anemia   Additional history obtained:  Additional history obtained from EMR External records from outside source obtained and reviewed including prior labs and imaging on file    Lab Tests:  I Ordered, and personally interpreted labs.  The pertinent results include: CBC with hemoglobin of 9.4, down from prior 12 5 years ago.  hCG negative.  CMP without significant findings.   Imaging Studies ordered:  I ordered imaging studies including CT a/p  I independently visualized and interpreted imaging which showed enlarged inguinal lymph nodes bilaterally  I agree with the radiologist interpretation    Problem List / ED Course / Critical interventions / Medication management  39 yo female with complaint of, what she thought were, bilateral inguinal boils. On exam, she was found to have enlarged lymph nodes bilaterally which are mobile and TTP. There is no overlying erythema. She denies any leg wounds, vaginal dc, illness. She has minimal  lower abdominal discomfort with deep palpation. CT is largely unremarkable. Call to radiology to review images again as Lns were not mentioned in report. Radiologist agrees Lns are prominent and reactive but no other findings related to this. Discussed with patient. Will add on HIV, RPR, gc/chlamydia. Will start abx for lymphadenopathy. Discussed possibility of infection vs cancer and need for close follow up and likely additional work up (ultrasound, bx). Patient plans to call her insurance to figure out who her PCP is for follow up care.  I have reviewed the patients home medicines and have made adjustments as needed   Consultations Obtained:  I requested consultation with the radiologist,  and discussed lab and imaging findings as well as pertinent plan - they recommend: call to discuss bilateral prominent inguinal Lns, radiology has reviewed the imaging, Lns  are prominent but no identifiably pathology on this CT.    Social Determinants of Health:  Has PCP, unsure who   Test / Admission - Considered:  Stable for dc with close follow up      Final diagnoses:  Inguinal lymphadenopathy    ED Discharge Orders          Ordered    cephALEXin  (KEFLEX ) 500 MG capsule  4 times daily        06/12/24 0504               Beverley Leita LABOR, PA-C 06/12/24 0520    Griselda Norris, MD 06/12/24 [phone]  "

## 2024-06-12 NOTE — Progress Notes (Incomplete)
HIV testing

## 2024-06-13 LAB — GC/CHLAMYDIA PROBE AMP (~~LOC~~) NOT AT ARMC
Chlamydia: NEGATIVE
Comment: NEGATIVE
Comment: NORMAL
Neisseria Gonorrhea: NEGATIVE

## 2024-06-15 LAB — HIV-1/2 AB - DIFFERENTIATION
HIV 1 Ab: REACTIVE
HIV 2 Ab: NONREACTIVE

## 2024-06-16 ENCOUNTER — Telehealth: Payer: Self-pay

## 2024-06-16 DIAGNOSIS — B20 Human immunodeficiency virus [HIV] disease: Secondary | ICD-10-CM | POA: Insufficient documentation

## 2024-06-16 NOTE — Telephone Encounter (Signed)
 Notified of HIV-1 (+) Ab.   Per chart review, patient has HIV history. Earliest mention is from 2021. Unable to find any records regarding ART.  Called [facility] to confirm date of initial diagnosis, they are closed for [facility] holiday.  Called patient to see if she is currently in care with ID, no answer. Left HIPAA compliant voicemail requesting callback.   No other results available in [facility] portal.  Duwaine Lowe, BSN, RN

## 2024-06-17 NOTE — Telephone Encounter (Signed)
 Spoke with Vanessa at [facility], patient was originally diagnosed 04/15/2011 in [place] .  She had undetectable viral loads 2017, 2018, and 2019. She was in care in [place], but had a detectable viral load in 2020. No other information or labs since 2020.   Will continue to try and connect patient to care.   Nichael Ehly, BSN, RN

## 2024-06-18 NOTE — Telephone Encounter (Signed)
 Second attempt to reach patient by phone, no answer. Message left with previous attempt.   Ebert Forrester, BSN, RN

## 2024-06-19 ENCOUNTER — Ambulatory Visit: Admitting: Student

## 2024-06-19 NOTE — Progress Notes (Unsigned)
 Error- pt no show

## 2024-07-16 ENCOUNTER — Ambulatory Visit: Admitting: Physician Assistant
